# Patient Record
Sex: Female | Born: 1940 | Race: White | Hispanic: No | Marital: Married
Health system: Southern US, Community
[De-identification: ages and names within clinical notes are randomized; demographics above are authoritative.]

---

## 2004-01-26 ENCOUNTER — Emergency Department: Payer: Self-pay | Admitting: Emergency Medicine

## 2004-11-08 ENCOUNTER — Other Ambulatory Visit: Payer: Self-pay

## 2004-11-08 ENCOUNTER — Inpatient Hospital Stay: Payer: Self-pay | Admitting: Internal Medicine

## 2007-12-05 ENCOUNTER — Emergency Department: Payer: Self-pay | Admitting: Emergency Medicine

## 2009-08-09 ENCOUNTER — Emergency Department: Payer: Self-pay | Admitting: Emergency Medicine

## 2009-08-14 ENCOUNTER — Emergency Department: Payer: Self-pay | Admitting: Emergency Medicine

## 2009-09-07 ENCOUNTER — Emergency Department: Payer: Self-pay | Admitting: Emergency Medicine

## 2009-10-28 ENCOUNTER — Inpatient Hospital Stay: Payer: Self-pay | Admitting: Family Medicine

## 2009-12-15 ENCOUNTER — Emergency Department: Payer: Self-pay | Admitting: Emergency Medicine

## 2009-12-30 ENCOUNTER — Emergency Department: Payer: Self-pay | Admitting: Emergency Medicine

## 2010-03-01 ENCOUNTER — Ambulatory Visit: Payer: Self-pay | Admitting: Ophthalmology

## 2010-03-07 ENCOUNTER — Ambulatory Visit: Payer: Self-pay | Admitting: Ophthalmology

## 2010-07-14 ENCOUNTER — Emergency Department: Payer: Self-pay | Admitting: Emergency Medicine

## 2010-08-21 ENCOUNTER — Emergency Department: Payer: Self-pay | Admitting: Internal Medicine

## 2011-06-30 ENCOUNTER — Emergency Department: Payer: Self-pay | Admitting: Emergency Medicine

## 2011-06-30 LAB — COMPREHENSIVE METABOLIC PANEL
Albumin: 3.1 g/dL — ABNORMAL LOW (ref 3.4–5.0)
Alkaline Phosphatase: 192 U/L — ABNORMAL HIGH (ref 50–136)
BUN: 30 mg/dL — ABNORMAL HIGH (ref 7–18)
Bilirubin,Total: 0.3 mg/dL (ref 0.2–1.0)
Co2: 25 mmol/L (ref 21–32)
Creatinine: 0.95 mg/dL (ref 0.60–1.30)
EGFR (African American): >60
EGFR (Non-African Amer.): >60
Glucose: 556 mg/dL (ref 65–99)
Osmolality: 302 (ref 275–301)
SGPT (ALT): 80 U/L — ABNORMAL HIGH

## 2011-06-30 LAB — URINALYSIS, COMPLETE
Bilirubin,UR: NEGATIVE
Blood: NEGATIVE
Glucose,UR: >=500 mg/dL (ref 0–75)
Ketone: NEGATIVE
Nitrite: NEGATIVE
Ph: 5 (ref 4.5–8.0)
Protein: 100
Specific Gravity: 1.018 (ref 1.003–1.030)
Squamous Epithelial: NONE SEEN
WBC UR: 5 /HPF (ref 0–5)

## 2011-06-30 LAB — CBC
HCT: 34.9 % — ABNORMAL LOW (ref 35.0–47.0)
MCV: 94 fL (ref 80–100)
Platelet: 168 10*3/uL (ref 150–440)
RBC: 3.73 10*6/uL — ABNORMAL LOW (ref 3.80–5.20)
WBC: 7.9 10*3/uL (ref 3.6–11.0)

## 2011-06-30 LAB — PROTIME-INR: Prothrombin Time: 12.8 secs (ref 11.5–14.7)

## 2011-06-30 LAB — APTT: Activated PTT: 31.5 secs (ref 23.6–35.9)

## 2011-10-16 ENCOUNTER — Emergency Department: Payer: Self-pay | Admitting: Emergency Medicine

## 2011-10-16 LAB — BASIC METABOLIC PANEL
Calcium, Total: 8.9 mg/dL (ref 8.5–10.1)
Chloride: 103 mmol/L (ref 98–107)
Co2: 24 mmol/L (ref 21–32)
Glucose: 406 mg/dL — ABNORMAL HIGH (ref 65–99)
Osmolality: 293 (ref 275–301)
Potassium: 4.4 mmol/L (ref 3.5–5.1)

## 2011-10-16 LAB — URINALYSIS, COMPLETE
Bilirubin,UR: NEGATIVE
Ketone: NEGATIVE
Leukocyte Esterase: NEGATIVE
Nitrite: POSITIVE
Protein: 100
RBC,UR: 3 /HPF (ref 0–5)
WBC UR: 4 /HPF (ref 0–5)

## 2011-10-16 LAB — CBC
HCT: 39.3 % (ref 35.0–47.0)
HGB: 13.5 g/dL (ref 12.0–16.0)
MCH: 30.9 pg (ref 26.0–34.0)
MCV: 90 fL (ref 80–100)
Platelet: 175 10*3/uL (ref 150–440)
RDW: 13 % (ref 11.5–14.5)
WBC: 8.4 10*3/uL (ref 3.6–11.0)

## 2011-11-07 ENCOUNTER — Emergency Department: Payer: Self-pay | Admitting: Emergency Medicine

## 2011-11-07 LAB — URINALYSIS, COMPLETE
Bilirubin,UR: NEGATIVE
Glucose,UR: >=500 mg/dL (ref 0–75)
Hyaline Cast: 1
Ketone: NEGATIVE
Leukocyte Esterase: NEGATIVE
Nitrite: NEGATIVE
Specific Gravity: 1.025 (ref 1.003–1.030)
WBC UR: 4 /HPF (ref 0–5)

## 2011-11-07 LAB — COMPREHENSIVE METABOLIC PANEL
Anion Gap: 7 (ref 7–16)
Calcium, Total: 8.4 mg/dL — ABNORMAL LOW (ref 8.5–10.1)
Chloride: 107 mmol/L (ref 98–107)
Co2: 26 mmol/L (ref 21–32)
EGFR (African American): >60
EGFR (Non-African Amer.): >60
Potassium: 3.6 mmol/L (ref 3.5–5.1)
SGOT(AST): 22 U/L (ref 15–37)
SGPT (ALT): 26 U/L (ref 12–78)
Total Protein: 6.2 g/dL — ABNORMAL LOW (ref 6.4–8.2)

## 2011-11-07 LAB — CBC WITH DIFFERENTIAL/PLATELET
Basophil #: 0.1 10*3/uL (ref 0.0–0.1)
Eosinophil #: 0.1 10*3/uL (ref 0.0–0.7)
Eosinophil %: 0.8 %
HCT: 34.9 % — ABNORMAL LOW (ref 35.0–47.0)
Lymphocyte %: 26.8 %
MCHC: 35.9 g/dL (ref 32.0–36.0)
Monocyte #: 0.5 x10 3/mm (ref 0.2–0.9)
Neutrophil %: 65.7 %
Platelet: 145 10*3/uL — ABNORMAL LOW (ref 150–440)
RBC: 3.99 10*6/uL (ref 3.80–5.20)
RDW: 12.8 % (ref 11.5–14.5)

## 2011-11-07 LAB — TROPONIN I: Troponin-I: <0.02 ng/mL

## 2011-11-07 LAB — DRUG SCREEN, URINE
Amphetamines, Ur Screen: NEGATIVE (ref ?–1000)
Barbiturates, Ur Screen: NEGATIVE (ref ?–200)
Benzodiazepine, Ur Scrn: NEGATIVE (ref ?–200)
Cannabinoid 50 Ng, Ur ~~LOC~~: NEGATIVE (ref ?–50)
Cocaine Metabolite,Ur ~~LOC~~: NEGATIVE (ref ?–300)
Opiate, Ur Screen: POSITIVE (ref ?–300)
Phencyclidine (PCP) Ur S: NEGATIVE (ref ?–25)
Tricyclic, Ur Screen: NEGATIVE (ref ?–1000)

## 2011-11-07 LAB — CK TOTAL AND CKMB (NOT AT ARMC)
CK, Total: 52 U/L (ref 21–215)
CK-MB: 2.5 ng/mL (ref 0.5–3.6)

## 2011-11-07 LAB — ETHANOL: Ethanol %: <0.003 % (ref 0.000–0.080)

## 2011-12-20 ENCOUNTER — Emergency Department: Payer: Self-pay | Admitting: Emergency Medicine

## 2011-12-20 LAB — BASIC METABOLIC PANEL
Anion Gap: 7 (ref 7–16)
Calcium, Total: 8.2 mg/dL — ABNORMAL LOW (ref 8.5–10.1)
Chloride: 103 mmol/L (ref 98–107)
Co2: 24 mmol/L (ref 21–32)
EGFR (African American): >60
EGFR (Non-African Amer.): >60
Osmolality: 298 (ref 275–301)
Sodium: 134 mmol/L — ABNORMAL LOW (ref 136–145)

## 2011-12-20 LAB — TROPONIN I
Troponin-I: 0.03 ng/mL
Troponin-I: 0.03 ng/mL

## 2011-12-20 LAB — CK TOTAL AND CKMB (NOT AT ARMC)
CK, Total: 118 U/L (ref 21–215)
CK, Total: 132 U/L (ref 21–215)
CK-MB: 3.6 ng/mL (ref 0.5–3.6)
CK-MB: 4.2 ng/mL — ABNORMAL HIGH (ref 0.5–3.6)

## 2011-12-20 LAB — CBC
Platelet: 177 10*3/uL (ref 150–440)
WBC: 6.5 10*3/uL (ref 3.6–11.0)

## 2012-01-07 ENCOUNTER — Inpatient Hospital Stay: Payer: Self-pay | Admitting: Family Medicine

## 2012-01-07 LAB — URINALYSIS, COMPLETE
Bacteria: NONE SEEN
Bilirubin,UR: NEGATIVE
Glucose,UR: >=500 mg/dL (ref 0–75)
Ketone: NEGATIVE
Nitrite: NEGATIVE
Protein: >=500
RBC,UR: 30 /HPF (ref 0–5)
Specific Gravity: 1.028 (ref 1.003–1.030)
WBC UR: 241 /HPF (ref 0–5)

## 2012-01-07 LAB — COMPREHENSIVE METABOLIC PANEL
Alkaline Phosphatase: 186 U/L — ABNORMAL HIGH (ref 50–136)
BUN: 6 mg/dL — ABNORMAL LOW (ref 7–18)
Calcium, Total: 8.4 mg/dL — ABNORMAL LOW (ref 8.5–10.1)
Co2: 30 mmol/L (ref 21–32)
EGFR (Non-African Amer.): >60
Glucose: 384 mg/dL — ABNORMAL HIGH (ref 65–99)
SGOT(AST): 20 U/L (ref 15–37)
SGPT (ALT): 17 U/L (ref 12–78)
Sodium: 137 mmol/L (ref 136–145)
Total Protein: 6.7 g/dL (ref 6.4–8.2)

## 2012-01-07 LAB — DRUG SCREEN, URINE
Amphetamines, Ur Screen: NEGATIVE (ref ?–1000)
Cocaine Metabolite,Ur ~~LOC~~: NEGATIVE (ref ?–300)
MDMA (Ecstasy)Ur Screen: NEGATIVE (ref ?–500)
Methadone, Ur Screen: NEGATIVE (ref ?–300)
Opiate, Ur Screen: POSITIVE (ref ?–300)
Phencyclidine (PCP) Ur S: NEGATIVE (ref ?–25)
Tricyclic, Ur Screen: NEGATIVE (ref ?–1000)

## 2012-01-07 LAB — CBC WITH DIFFERENTIAL/PLATELET
MCHC: 33.4 g/dL (ref 32.0–36.0)
MCV: 90 fL (ref 80–100)
Platelet: 231 10*3/uL (ref 150–440)
RDW: 13.3 % (ref 11.5–14.5)
Segmented Neutrophils: 57 %
Variant Lymphocyte - H1-Rlymph: 1 %
WBC: 8.5 10*3/uL (ref 3.6–11.0)

## 2012-01-07 LAB — MAGNESIUM: Magnesium: 1.7 mg/dL — ABNORMAL LOW

## 2012-01-07 LAB — SEDIMENTATION RATE: Erythrocyte Sed Rate: 85 mm/hr — ABNORMAL HIGH (ref 0–30)

## 2012-01-08 LAB — CBC WITH DIFFERENTIAL/PLATELET
Eosinophil #: 0.1 10*3/uL (ref 0.0–0.7)
Eosinophil %: 0.8 %
HCT: 32.8 % — ABNORMAL LOW (ref 35.0–47.0)
Lymphocyte #: 2.8 10*3/uL (ref 1.0–3.6)
MCH: 31.2 pg (ref 26.0–34.0)
MCHC: 35.3 g/dL (ref 32.0–36.0)
MCV: 89 fL (ref 80–100)
Monocyte #: 0.8 x10 3/mm (ref 0.2–0.9)
Neutrophil #: 4.5 10*3/uL (ref 1.4–6.5)
Platelet: 201 10*3/uL (ref 150–440)
RDW: 13.1 % (ref 11.5–14.5)
WBC: 8.2 10*3/uL (ref 3.6–11.0)

## 2012-01-08 LAB — BASIC METABOLIC PANEL
Anion Gap: 6 — ABNORMAL LOW (ref 7–16)
Anion Gap: 5 — ABNORMAL LOW (ref 7–16)
BUN: 7 mg/dL (ref 7–18)
Calcium, Total: 8 mg/dL — ABNORMAL LOW (ref 8.5–10.1)
Chloride: 108 mmol/L — ABNORMAL HIGH (ref 98–107)
Co2: 28 mmol/L (ref 21–32)
Creatinine: 0.78 mg/dL (ref 0.60–1.30)
EGFR (African American): >60
EGFR (Non-African Amer.): >60
EGFR (Non-African Amer.): >60
Glucose: 139 mg/dL — ABNORMAL HIGH (ref 65–99)
Glucose: 196 mg/dL — ABNORMAL HIGH (ref 65–99)
Osmolality: 283 (ref 275–301)
Osmolality: 285 (ref 275–301)
Sodium: 142 mmol/L (ref 136–145)

## 2012-01-09 LAB — CBC WITH DIFFERENTIAL/PLATELET
Basophil #: 0 10*3/uL (ref 0.0–0.1)
Basophil %: 0.5 %
HCT: 31.7 % — ABNORMAL LOW (ref 35.0–47.0)
HGB: 11 g/dL — ABNORMAL LOW (ref 12.0–16.0)
Lymphocyte #: 2.6 10*3/uL (ref 1.0–3.6)
Lymphocyte %: 25.4 %
MCH: 31.3 pg (ref 26.0–34.0)
MCHC: 34.8 g/dL (ref 32.0–36.0)
MCV: 90 fL (ref 80–100)
Monocyte #: 0.8 x10 3/mm (ref 0.2–0.9)
Monocyte %: 7.8 %
Neutrophil #: 6.6 10*3/uL — ABNORMAL HIGH (ref 1.4–6.5)
Neutrophil %: 65.9 %
RBC: 3.53 10*6/uL — ABNORMAL LOW (ref 3.80–5.20)
WBC: 10.1 10*3/uL (ref 3.6–11.0)

## 2012-01-09 LAB — LIPID PANEL
Cholesterol: 140 mg/dL (ref 0–200)
HDL Cholesterol: 62 mg/dL — ABNORMAL HIGH (ref 40–60)
VLDL Cholesterol, Calc: 16 mg/dL (ref 5–40)

## 2012-01-09 LAB — HEMOGLOBIN A1C: Hemoglobin A1C: 12 % — ABNORMAL HIGH (ref 4.2–6.3)

## 2012-01-10 LAB — BASIC METABOLIC PANEL
Anion Gap: 7 (ref 7–16)
Calcium, Total: 8.4 mg/dL — ABNORMAL LOW (ref 8.5–10.1)
Chloride: 105 mmol/L (ref 98–107)
Co2: 27 mmol/L (ref 21–32)
EGFR (African American): >60
Osmolality: 282 (ref 275–301)

## 2012-01-11 LAB — CBC WITH DIFFERENTIAL/PLATELET
Basophil #: 0.1 10*3/uL (ref 0.0–0.1)
Eosinophil %: 0.7 %
HCT: 33.1 % — ABNORMAL LOW (ref 35.0–47.0)
HGB: 11.6 g/dL — ABNORMAL LOW (ref 12.0–16.0)
Lymphocyte #: 1.9 10*3/uL (ref 1.0–3.6)
MCH: 31.5 pg (ref 26.0–34.0)
MCV: 90 fL (ref 80–100)
Monocyte #: 0.5 x10 3/mm (ref 0.2–0.9)
Monocyte %: 6.8 %
Neutrophil %: 67.4 %
Platelet: 243 10*3/uL (ref 150–440)
RBC: 3.7 10*6/uL — ABNORMAL LOW (ref 3.80–5.20)
RDW: 12.9 % (ref 11.5–14.5)

## 2012-01-11 LAB — BASIC METABOLIC PANEL
Calcium, Total: 8.3 mg/dL — ABNORMAL LOW (ref 8.5–10.1)
Co2: 28 mmol/L (ref 21–32)
Creatinine: 0.81 mg/dL (ref 0.60–1.30)
EGFR (African American): >60
EGFR (Non-African Amer.): >60
Glucose: 301 mg/dL — ABNORMAL HIGH (ref 65–99)
Potassium: 4.7 mmol/L (ref 3.5–5.1)
Sodium: 137 mmol/L (ref 136–145)

## 2012-01-11 LAB — VANCOMYCIN, TROUGH: Vancomycin, Trough: 5 ug/mL — ABNORMAL LOW (ref 10–20)

## 2012-01-12 LAB — CBC WITH DIFFERENTIAL/PLATELET
Basophil %: 0.7 %
Eosinophil #: 0 10*3/uL (ref 0.0–0.7)
Eosinophil %: 0.3 %
HCT: 35.4 % (ref 35.0–47.0)
HGB: 12.2 g/dL (ref 12.0–16.0)
Lymphocyte #: 1.6 10*3/uL (ref 1.0–3.6)
Lymphocyte %: 17.8 %
MCH: 31 pg (ref 26.0–34.0)
MCV: 90 fL (ref 80–100)
Monocyte #: 0.5 x10 3/mm (ref 0.2–0.9)
Monocyte %: 6 %
Platelet: 286 10*3/uL (ref 150–440)
RBC: 3.94 10*6/uL (ref 3.80–5.20)
WBC: 9 10*3/uL (ref 3.6–11.0)

## 2012-01-12 LAB — BASIC METABOLIC PANEL
Anion Gap: 4 — ABNORMAL LOW (ref 7–16)
Calcium, Total: 8.7 mg/dL (ref 8.5–10.1)
Co2: 27 mmol/L (ref 21–32)
Creatinine: 0.84 mg/dL (ref 0.60–1.30)
EGFR (African American): >60
Glucose: 317 mg/dL — ABNORMAL HIGH (ref 65–99)
Osmolality: 275 (ref 275–301)

## 2012-01-13 LAB — BASIC METABOLIC PANEL
Anion Gap: 2 — ABNORMAL LOW (ref 7–16)
Chloride: 101 mmol/L (ref 98–107)
Co2: 29 mmol/L (ref 21–32)
EGFR (African American): >60
EGFR (Non-African Amer.): >60
Glucose: 342 mg/dL — ABNORMAL HIGH (ref 65–99)
Osmolality: 278 (ref 275–301)

## 2012-01-13 LAB — CBC WITH DIFFERENTIAL/PLATELET
Basophil #: 0.1 10*3/uL (ref 0.0–0.1)
Basophil %: 0.8 %
Eosinophil #: 0.1 10*3/uL (ref 0.0–0.7)
HCT: 34.9 % — ABNORMAL LOW (ref 35.0–47.0)
HGB: 11.9 g/dL — ABNORMAL LOW (ref 12.0–16.0)
Lymphocyte %: 23.8 %
MCH: 30.7 pg (ref 26.0–34.0)
MCHC: 34.2 g/dL (ref 32.0–36.0)
MCV: 90 fL (ref 80–100)
Monocyte #: 0.8 x10 3/mm (ref 0.2–0.9)

## 2012-01-14 LAB — CBC WITH DIFFERENTIAL/PLATELET
Basophil #: 0.1 10*3/uL (ref 0.0–0.1)
Basophil %: 0.8 %
Eosinophil #: 0.1 10*3/uL (ref 0.0–0.7)
Lymphocyte #: 3.3 10*3/uL (ref 1.0–3.6)
MCH: 31.1 pg (ref 26.0–34.0)
MCHC: 35.6 g/dL (ref 32.0–36.0)
MCV: 87 fL (ref 80–100)
Monocyte #: 1.1 x10 3/mm — ABNORMAL HIGH (ref 0.2–0.9)
Neutrophil %: 52.3 %
Platelet: 190 10*3/uL (ref 150–440)
RBC: 3.87 10*6/uL (ref 3.80–5.20)

## 2012-01-14 LAB — BASIC METABOLIC PANEL
Anion Gap: 7 (ref 7–16)
BUN: 14 mg/dL (ref 7–18)
Chloride: 102 mmol/L (ref 98–107)
Co2: 28 mmol/L (ref 21–32)
Creatinine: 0.81 mg/dL (ref 0.60–1.30)
EGFR (African American): >60
EGFR (Non-African Amer.): >60
Osmolality: 277 (ref 275–301)
Potassium: 3.7 mmol/L (ref 3.5–5.1)

## 2012-01-14 LAB — VANCOMYCIN, TROUGH: Vancomycin, Trough: 13 ug/mL (ref 10–20)

## 2012-01-15 LAB — CBC WITH DIFFERENTIAL/PLATELET
Basophil %: 0.8 %
Eosinophil #: 0.1 10*3/uL (ref 0.0–0.7)
Eosinophil %: 0.9 %
Lymphocyte %: 35.4 %
MCH: 29.9 pg (ref 26.0–34.0)
MCHC: 33.8 g/dL (ref 32.0–36.0)
Monocyte %: 10.4 %
Neutrophil #: 5 10*3/uL (ref 1.4–6.5)
Neutrophil %: 52.5 %
RBC: 3.73 10*6/uL — ABNORMAL LOW (ref 3.80–5.20)

## 2012-01-15 LAB — BASIC METABOLIC PANEL
Anion Gap: 5 — ABNORMAL LOW (ref 7–16)
Calcium, Total: 9 mg/dL (ref 8.5–10.1)
Co2: 29 mmol/L (ref 21–32)
EGFR (African American): >60
Glucose: 175 mg/dL — ABNORMAL HIGH (ref 65–99)
Osmolality: 275 (ref 275–301)
Potassium: 3.4 mmol/L — ABNORMAL LOW (ref 3.5–5.1)
Sodium: 135 mmol/L — ABNORMAL LOW (ref 136–145)

## 2012-01-16 ENCOUNTER — Emergency Department: Payer: Self-pay | Admitting: Emergency Medicine

## 2012-01-18 ENCOUNTER — Inpatient Hospital Stay: Payer: Self-pay | Admitting: Internal Medicine

## 2012-01-18 LAB — COMPREHENSIVE METABOLIC PANEL
Albumin: 2.4 g/dL — ABNORMAL LOW (ref 3.4–5.0)
Alkaline Phosphatase: 281 U/L — ABNORMAL HIGH (ref 50–136)
Anion Gap: 10 (ref 7–16)
BUN: 19 mg/dL — ABNORMAL HIGH (ref 7–18)
Calcium, Total: 8.9 mg/dL (ref 8.5–10.1)
Co2: 26 mmol/L (ref 21–32)
EGFR (African American): >60
Glucose: 470 mg/dL — ABNORMAL HIGH (ref 65–99)
Osmolality: 293 (ref 275–301)
Potassium: 3.4 mmol/L — ABNORMAL LOW (ref 3.5–5.1)
SGOT(AST): 18 U/L (ref 15–37)
SGPT (ALT): 27 U/L (ref 12–78)
Sodium: 135 mmol/L — ABNORMAL LOW (ref 136–145)

## 2012-01-18 LAB — URINALYSIS, COMPLETE
Bacteria: NONE SEEN
Bilirubin,UR: NEGATIVE
Glucose,UR: >=500 mg/dL (ref 0–75)
Ketone: NEGATIVE
Specific Gravity: 1.025 (ref 1.003–1.030)
Squamous Epithelial: 1

## 2012-01-18 LAB — CBC
HCT: 35.5 % (ref 35.0–47.0)
MCH: 29.9 pg (ref 26.0–34.0)
RBC: 3.97 10*6/uL (ref 3.80–5.20)
RDW: 12.7 % (ref 11.5–14.5)
WBC: 8.5 10*3/uL (ref 3.6–11.0)

## 2012-01-18 LAB — ETHANOL: Ethanol %: <0.003 % (ref 0.000–0.080)

## 2012-01-18 LAB — DRUG SCREEN, URINE
Barbiturates, Ur Screen: NEGATIVE (ref ?–200)
Cannabinoid 50 Ng, Ur ~~LOC~~: NEGATIVE (ref ?–50)
Cocaine Metabolite,Ur ~~LOC~~: NEGATIVE (ref ?–300)
Opiate, Ur Screen: NEGATIVE (ref ?–300)
Phencyclidine (PCP) Ur S: NEGATIVE (ref ?–25)

## 2012-01-19 LAB — BASIC METABOLIC PANEL
Calcium, Total: 8.4 mg/dL — ABNORMAL LOW (ref 8.5–10.1)
Chloride: 99 mmol/L (ref 98–107)
Co2: 25 mmol/L (ref 21–32)
Creatinine: 1.09 mg/dL (ref 0.60–1.30)
EGFR (Non-African Amer.): 52 — ABNORMAL LOW
Potassium: 3.5 mmol/L (ref 3.5–5.1)
Sodium: 134 mmol/L — ABNORMAL LOW (ref 136–145)

## 2012-01-19 LAB — CBC WITH DIFFERENTIAL/PLATELET
Basophil #: 0.1 10*3/uL (ref 0.0–0.1)
HCT: 32.4 % — ABNORMAL LOW (ref 35.0–47.0)
HGB: 11.1 g/dL — ABNORMAL LOW (ref 12.0–16.0)
Lymphocyte #: 3.1 10*3/uL (ref 1.0–3.6)
MCH: 30.1 pg (ref 26.0–34.0)
MCHC: 34.2 g/dL (ref 32.0–36.0)
MCV: 88 fL (ref 80–100)
Monocyte #: 0.8 x10 3/mm (ref 0.2–0.9)
Monocyte %: 8.8 %
Neutrophil #: 4.9 10*3/uL (ref 1.4–6.5)
Platelet: 351 10*3/uL (ref 150–440)
RDW: 13 % (ref 11.5–14.5)

## 2012-01-21 LAB — BASIC METABOLIC PANEL
BUN: 15 mg/dL (ref 7–18)
Calcium, Total: 8.4 mg/dL — ABNORMAL LOW (ref 8.5–10.1)
EGFR (African American): >60
EGFR (Non-African Amer.): >60
Glucose: 274 mg/dL — ABNORMAL HIGH (ref 65–99)
Osmolality: 284 (ref 275–301)
Potassium: 3.5 mmol/L (ref 3.5–5.1)
Sodium: 137 mmol/L (ref 136–145)

## 2012-01-21 LAB — WOUND AEROBIC CULTURE

## 2012-01-21 LAB — VANCOMYCIN, TROUGH: Vancomycin, Trough: 8 ug/mL — ABNORMAL LOW (ref 10–20)

## 2012-01-22 LAB — CBC WITH DIFFERENTIAL/PLATELET
Basophil #: 0.1 10*3/uL (ref 0.0–0.1)
Eosinophil #: 0.1 10*3/uL (ref 0.0–0.7)
HCT: 29.1 % — ABNORMAL LOW (ref 35.0–47.0)
Lymphocyte #: 2.5 10*3/uL (ref 1.0–3.6)
Lymphocyte %: 43.8 %
MCH: 30.1 pg (ref 26.0–34.0)
MCHC: 34.5 g/dL (ref 32.0–36.0)
MCV: 87 fL (ref 80–100)
Monocyte #: 0.7 x10 3/mm (ref 0.2–0.9)
Neutrophil #: 2.4 10*3/uL (ref 1.4–6.5)
RBC: 3.33 10*6/uL — ABNORMAL LOW (ref 3.80–5.20)
RDW: 12.9 % (ref 11.5–14.5)

## 2012-01-23 LAB — CBC WITH DIFFERENTIAL/PLATELET
Basophil #: 0.1 10*3/uL (ref 0.0–0.1)
Eosinophil #: 0.1 10*3/uL (ref 0.0–0.7)
Eosinophil %: 1.9 %
HCT: 29.6 % — ABNORMAL LOW (ref 35.0–47.0)
HGB: 10.3 g/dL — ABNORMAL LOW (ref 12.0–16.0)
Lymphocyte #: 2.4 10*3/uL (ref 1.0–3.6)
Lymphocyte %: 44.8 %
MCHC: 34.8 g/dL (ref 32.0–36.0)
Monocyte %: 10.4 %
Neutrophil #: 2.3 10*3/uL (ref 1.4–6.5)
Neutrophil %: 41.7 %
RBC: 3.39 10*6/uL — ABNORMAL LOW (ref 3.80–5.20)
RDW: 12.9 % (ref 11.5–14.5)
WBC: 5.4 10*3/uL (ref 3.6–11.0)

## 2012-01-23 LAB — BASIC METABOLIC PANEL
Anion Gap: 6 — ABNORMAL LOW (ref 7–16)
BUN: 21 mg/dL — ABNORMAL HIGH (ref 7–18)
Calcium, Total: 8.5 mg/dL (ref 8.5–10.1)
EGFR (Non-African Amer.): 57 — ABNORMAL LOW
Glucose: 269 mg/dL — ABNORMAL HIGH (ref 65–99)
Osmolality: 290 (ref 275–301)
Potassium: 3.8 mmol/L (ref 3.5–5.1)
Sodium: 139 mmol/L (ref 136–145)

## 2012-03-06 ENCOUNTER — Inpatient Hospital Stay: Payer: Self-pay | Admitting: Internal Medicine

## 2012-03-06 LAB — PROTIME-INR
INR: 1
Prothrombin Time: 13.6 secs (ref 11.5–14.7)

## 2012-03-06 LAB — CK TOTAL AND CKMB (NOT AT ARMC)
CK, Total: 48 U/L (ref 21–215)
CK, Total: 85 U/L (ref 21–215)
CK-MB: 1.5 ng/mL (ref 0.5–3.6)
CK-MB: 4.4 ng/mL — ABNORMAL HIGH (ref 0.5–3.6)

## 2012-03-06 LAB — BASIC METABOLIC PANEL
Anion Gap: 9 (ref 7–16)
Calcium, Total: 8.5 mg/dL (ref 8.5–10.1)
Chloride: 105 mmol/L (ref 98–107)
EGFR (Non-African Amer.): 60 — ABNORMAL LOW
Glucose: 291 mg/dL — ABNORMAL HIGH (ref 65–99)
Potassium: 3.9 mmol/L (ref 3.5–5.1)
Sodium: 140 mmol/L (ref 136–145)

## 2012-03-06 LAB — CBC
HCT: 30.3 % — ABNORMAL LOW (ref 35.0–47.0)
MCH: 30.2 pg (ref 26.0–34.0)
MCHC: 33.8 g/dL (ref 32.0–36.0)
Platelet: 221 10*3/uL (ref 150–440)
RBC: 3.38 10*6/uL — ABNORMAL LOW (ref 3.80–5.20)
RDW: 14.8 % — ABNORMAL HIGH (ref 11.5–14.5)

## 2012-03-06 LAB — TROPONIN I: Troponin-I: 1.08 ng/mL — ABNORMAL HIGH

## 2012-03-06 LAB — CLOSTRIDIUM DIFFICILE BY PCR

## 2012-03-06 LAB — LIPASE, BLOOD: Lipase: 54 U/L — ABNORMAL LOW (ref 73–393)

## 2012-03-07 LAB — LIPID PANEL
Cholesterol: 130 mg/dL (ref 0–200)
HDL Cholesterol: 60 mg/dL (ref 40–60)
Ldl Cholesterol, Calc: 50 mg/dL (ref 0–100)
Triglycerides: 99 mg/dL (ref 0–200)
VLDL Cholesterol, Calc: 20 mg/dL (ref 5–40)

## 2012-03-07 LAB — CK TOTAL AND CKMB (NOT AT ARMC): CK-MB: 2.8 ng/mL (ref 0.5–3.6)

## 2012-03-07 LAB — TROPONIN I: Troponin-I: 0.93 ng/mL — ABNORMAL HIGH

## 2012-03-08 LAB — CBC WITH DIFFERENTIAL/PLATELET
Basophil %: 1.3 %
Eosinophil %: 2 %
HCT: 29.6 % — ABNORMAL LOW (ref 35.0–47.0)
HGB: 10.4 g/dL — ABNORMAL LOW (ref 12.0–16.0)
Lymphocyte #: 2.2 10*3/uL (ref 1.0–3.6)
MCV: 89 fL (ref 80–100)
Monocyte #: 0.6 x10 3/mm (ref 0.2–0.9)
Monocyte %: 11 %
Neutrophil %: 46.7 %
Platelet: 243 10*3/uL (ref 150–440)
RBC: 3.32 10*6/uL — ABNORMAL LOW (ref 3.80–5.20)
RDW: 14.8 % — ABNORMAL HIGH (ref 11.5–14.5)
WBC: 5.6 10*3/uL (ref 3.6–11.0)

## 2012-03-08 LAB — BASIC METABOLIC PANEL
BUN: 20 mg/dL — ABNORMAL HIGH (ref 7–18)
Calcium, Total: 8.7 mg/dL (ref 8.5–10.1)
Chloride: 105 mmol/L (ref 98–107)
Creatinine: 0.9 mg/dL (ref 0.60–1.30)
EGFR (Non-African Amer.): >60
Glucose: 201 mg/dL — ABNORMAL HIGH (ref 65–99)
Osmolality: 284 (ref 275–301)
Sodium: 138 mmol/L (ref 136–145)

## 2012-03-10 LAB — CREATININE, SERUM: EGFR (Non-African Amer.): >60

## 2012-03-15 ENCOUNTER — Other Ambulatory Visit: Payer: Self-pay | Admitting: Family Medicine

## 2012-03-15 LAB — BUN: BUN: 22 mg/dL — ABNORMAL HIGH (ref 7–18)

## 2012-03-15 LAB — CBC WITH DIFFERENTIAL/PLATELET
Eosinophil #: 0 10*3/uL (ref 0.0–0.7)
HGB: 11.7 g/dL — ABNORMAL LOW (ref 12.0–16.0)
Lymphocyte %: 25 %
Monocyte #: 0.4 x10 3/mm (ref 0.2–0.9)
Neutrophil #: 3.5 10*3/uL (ref 1.4–6.5)
Platelet: 175 10*3/uL (ref 150–440)
RBC: 3.87 10*6/uL (ref 3.80–5.20)
RDW: 15.1 % — ABNORMAL HIGH (ref 11.5–14.5)

## 2012-03-15 LAB — VANCOMYCIN, TROUGH: Vancomycin, Trough: 15 ug/mL (ref 10–20)

## 2012-03-15 LAB — CREATININE, SERUM
Creatinine: 1.06 mg/dL (ref 0.60–1.30)
EGFR (African American): >60
EGFR (Non-African Amer.): 53 — ABNORMAL LOW

## 2012-03-19 ENCOUNTER — Other Ambulatory Visit: Payer: Self-pay | Admitting: Family Medicine

## 2012-03-19 LAB — COMPREHENSIVE METABOLIC PANEL
Albumin: 2.8 g/dL — ABNORMAL LOW (ref 3.4–5.0)
BUN: 19 mg/dL — ABNORMAL HIGH (ref 7–18)
Bilirubin,Total: 0.3 mg/dL (ref 0.2–1.0)
Calcium, Total: 8.3 mg/dL — ABNORMAL LOW (ref 8.5–10.1)
EGFR (African American): 56 — ABNORMAL LOW
EGFR (Non-African Amer.): 48 — ABNORMAL LOW
Glucose: 436 mg/dL — ABNORMAL HIGH (ref 65–99)
Osmolality: 295 (ref 275–301)
Potassium: 3.4 mmol/L — ABNORMAL LOW (ref 3.5–5.1)
SGOT(AST): 77 U/L — ABNORMAL HIGH (ref 15–37)
SGPT (ALT): 54 U/L (ref 12–78)
Sodium: 137 mmol/L (ref 136–145)

## 2012-03-19 LAB — CBC WITH DIFFERENTIAL/PLATELET
Basophil #: 0.1 10*3/uL (ref 0.0–0.1)
Basophil %: 1.1 %
Eosinophil #: 0 10*3/uL (ref 0.0–0.7)
Eosinophil %: 0.9 %
HCT: 37.5 % (ref 35.0–47.0)
HGB: 12.2 g/dL (ref 12.0–16.0)
MCH: 29.5 pg (ref 26.0–34.0)
MCV: 91 fL (ref 80–100)
Neutrophil %: 61.7 %
Platelet: 168 10*3/uL (ref 150–440)
RBC: 4.13 10*6/uL (ref 3.80–5.20)
RDW: 15.6 % — ABNORMAL HIGH (ref 11.5–14.5)

## 2012-03-19 LAB — HEMOGLOBIN A1C: Hemoglobin A1C: 10 % — ABNORMAL HIGH (ref 4.2–6.3)

## 2012-03-24 ENCOUNTER — Other Ambulatory Visit: Payer: Self-pay | Admitting: Family Medicine

## 2012-03-24 LAB — BUN: BUN: 14 mg/dL (ref 7–18)

## 2012-03-24 LAB — VANCOMYCIN, TROUGH: Vancomycin, Trough: 14 ug/mL (ref 10–20)

## 2012-03-24 LAB — CREATININE, SERUM
Creatinine: 1.02 mg/dL (ref 0.60–1.30)
EGFR (African American): >60
EGFR (Non-African Amer.): 56 — ABNORMAL LOW

## 2012-03-29 ENCOUNTER — Other Ambulatory Visit: Payer: Self-pay | Admitting: Family Medicine

## 2012-03-29 LAB — CREATININE, SERUM
Creatinine: 1.17 mg/dL (ref 0.60–1.30)
EGFR (African American): 55 — ABNORMAL LOW

## 2012-04-01 ENCOUNTER — Other Ambulatory Visit: Payer: Self-pay | Admitting: Family Medicine

## 2012-05-11 ENCOUNTER — Ambulatory Visit: Payer: Self-pay | Admitting: Ophthalmology

## 2012-05-11 LAB — POTASSIUM: Potassium: 3.7 mmol/L (ref 3.5–5.1)

## 2012-05-24 ENCOUNTER — Ambulatory Visit: Payer: Self-pay | Admitting: Ophthalmology

## 2012-06-07 ENCOUNTER — Other Ambulatory Visit: Payer: Self-pay

## 2012-06-07 LAB — URINALYSIS, COMPLETE
Glucose,UR: NEGATIVE mg/dL (ref 0–75)
Ketone: NEGATIVE
Nitrite: NEGATIVE
Ph: 5 (ref 4.5–8.0)
Protein: 100
Squamous Epithelial: 1
WBC UR: 41 /HPF (ref 0–5)

## 2012-06-07 LAB — COMPREHENSIVE METABOLIC PANEL
Alkaline Phosphatase: 251 U/L — ABNORMAL HIGH (ref 50–136)
Bilirubin,Total: 0.3 mg/dL (ref 0.2–1.0)
Calcium, Total: 8.7 mg/dL (ref 8.5–10.1)
Co2: 21 mmol/L (ref 21–32)
EGFR (African American): 43 — ABNORMAL LOW
EGFR (Non-African Amer.): 37 — ABNORMAL LOW
Glucose: 185 mg/dL — ABNORMAL HIGH (ref 65–99)
Osmolality: 297 (ref 275–301)
Potassium: 4.1 mmol/L (ref 3.5–5.1)
SGOT(AST): 26 U/L (ref 15–37)
SGPT (ALT): 25 U/L (ref 12–78)
Total Protein: 7 g/dL (ref 6.4–8.2)

## 2012-06-07 LAB — CBC WITH DIFFERENTIAL/PLATELET
Basophil %: 0.9 %
HCT: 33 % — ABNORMAL LOW (ref 35.0–47.0)
Lymphocyte #: 2.9 10*3/uL (ref 1.0–3.6)
MCHC: 33.7 g/dL (ref 32.0–36.0)
MCV: 83 fL (ref 80–100)
Monocyte #: 0.5 x10 3/mm (ref 0.2–0.9)
Monocyte %: 6.7 %
Neutrophil #: 3.8 10*3/uL (ref 1.4–6.5)
Neutrophil %: 50.9 %
Platelet: 183 10*3/uL (ref 150–440)
RDW: 13.9 % (ref 11.5–14.5)
WBC: 7.5 10*3/uL (ref 3.6–11.0)

## 2012-06-11 LAB — URINE CULTURE

## 2012-06-26 ENCOUNTER — Observation Stay: Payer: Self-pay | Admitting: Internal Medicine

## 2012-06-26 LAB — CBC
HCT: 30.5 % — ABNORMAL LOW (ref 35.0–47.0)
WBC: 7.6 10*3/uL (ref 3.6–11.0)

## 2012-06-26 LAB — COMPREHENSIVE METABOLIC PANEL
Albumin: 2.8 g/dL — ABNORMAL LOW (ref 3.4–5.0)
Alkaline Phosphatase: 228 U/L — ABNORMAL HIGH (ref 50–136)
BUN: 79 mg/dL — ABNORMAL HIGH (ref 7–18)
Bilirubin,Total: 0.4 mg/dL (ref 0.2–1.0)
Calcium, Total: 8.6 mg/dL (ref 8.5–10.1)
EGFR (African American): 39 — ABNORMAL LOW
Potassium: 4.4 mmol/L (ref 3.5–5.1)

## 2012-06-26 LAB — CK TOTAL AND CKMB (NOT AT ARMC)
CK, Total: 56 U/L (ref 21–215)
CK-MB: 2.9 ng/mL (ref 0.5–3.6)
CK-MB: 2.6 ng/mL (ref 0.5–3.6)

## 2012-06-26 LAB — TROPONIN I: Troponin-I: <0.02 ng/mL

## 2012-06-27 LAB — BASIC METABOLIC PANEL
Anion Gap: 5 — ABNORMAL LOW (ref 7–16)
Chloride: 113 mmol/L — ABNORMAL HIGH (ref 98–107)
Co2: 25 mmol/L (ref 21–32)
EGFR (African American): 56 — ABNORMAL LOW
Osmolality: 310 (ref 275–301)
Potassium: 4.1 mmol/L (ref 3.5–5.1)
Sodium: 143 mmol/L (ref 136–145)

## 2012-06-27 LAB — CBC WITH DIFFERENTIAL/PLATELET
Basophil %: 0.6 %
Eosinophil #: 0.1 10*3/uL (ref 0.0–0.7)
Eosinophil %: 1.7 %
HCT: 30.7 % — ABNORMAL LOW (ref 35.0–47.0)
HGB: 10.6 g/dL — ABNORMAL LOW (ref 12.0–16.0)
Lymphocyte #: 2.8 10*3/uL (ref 1.0–3.6)
Lymphocyte %: 36.1 %
MCH: 28.8 pg (ref 26.0–34.0)
MCV: 84 fL (ref 80–100)
Neutrophil #: 3.9 10*3/uL (ref 1.4–6.5)
Neutrophil %: 51.9 %
RBC: 3.68 10*6/uL — ABNORMAL LOW (ref 3.80–5.20)
RDW: 13.9 % (ref 11.5–14.5)

## 2012-06-27 LAB — HEMOGLOBIN A1C: Hemoglobin A1C: 8.3 % — ABNORMAL HIGH (ref 4.2–6.3)

## 2012-06-27 LAB — TROPONIN I: Troponin-I: <0.02 ng/mL

## 2012-06-27 LAB — CK TOTAL AND CKMB (NOT AT ARMC): CK, Total: 53 U/L (ref 21–215)

## 2012-07-18 ENCOUNTER — Ambulatory Visit: Payer: Self-pay

## 2012-07-18 LAB — URINALYSIS, COMPLETE
Blood: NEGATIVE
Ketone: NEGATIVE
Squamous Epithelial: <1
WBC UR: 26 /HPF (ref 0–5)

## 2012-07-20 LAB — URINE CULTURE

## 2012-07-24 ENCOUNTER — Other Ambulatory Visit: Payer: Self-pay | Admitting: Family Medicine

## 2012-07-24 LAB — COMPREHENSIVE METABOLIC PANEL
Albumin: 2.9 g/dL — ABNORMAL LOW (ref 3.4–5.0)
Alkaline Phosphatase: 315 U/L — ABNORMAL HIGH (ref 50–136)
BUN: 45 mg/dL — ABNORMAL HIGH (ref 7–18)
Bilirubin,Total: 0.3 mg/dL (ref 0.2–1.0)
Chloride: 114 mmol/L — ABNORMAL HIGH (ref 98–107)
Co2: 26 mmol/L (ref 21–32)
Creatinine: 1.63 mg/dL — ABNORMAL HIGH (ref 0.60–1.30)
EGFR (African American): 37 — ABNORMAL LOW
EGFR (Non-African Amer.): 32 — ABNORMAL LOW
Glucose: 117 mg/dL — ABNORMAL HIGH (ref 65–99)
Sodium: 145 mmol/L (ref 136–145)
Total Protein: 6.4 g/dL (ref 6.4–8.2)

## 2012-07-24 LAB — CBC WITH DIFFERENTIAL/PLATELET
Basophil #: 0.1 10*3/uL (ref 0.0–0.1)
Basophil %: 0.6 %
HCT: 31.7 % — ABNORMAL LOW (ref 35.0–47.0)
Lymphocyte #: 2.7 10*3/uL (ref 1.0–3.6)
Lymphocyte %: 28.8 %
MCHC: 34.1 g/dL (ref 32.0–36.0)
MCV: 84 fL (ref 80–100)
Monocyte #: 0.8 x10 3/mm (ref 0.2–0.9)
Monocyte %: 8 %
Neutrophil #: 5.7 10*3/uL (ref 1.4–6.5)
Neutrophil %: 61.2 %
RBC: 3.76 10*6/uL — ABNORMAL LOW (ref 3.80–5.20)
RDW: 14.7 % — ABNORMAL HIGH (ref 11.5–14.5)

## 2012-08-27 ENCOUNTER — Ambulatory Visit: Payer: Self-pay | Admitting: Hospice and Palliative Medicine

## 2012-09-05 ENCOUNTER — Emergency Department: Payer: Self-pay | Admitting: Unknown Physician Specialty

## 2012-09-05 LAB — CBC
HCT: 28.1 % — ABNORMAL LOW (ref 35.0–47.0)
MCH: 31 pg (ref 26.0–34.0)
MCV: 88 fL (ref 80–100)
Platelet: 167 10*3/uL (ref 150–440)
RBC: 3.18 10*6/uL — ABNORMAL LOW (ref 3.80–5.20)
RDW: 14.7 % — ABNORMAL HIGH (ref 11.5–14.5)
WBC: 10.6 10*3/uL (ref 3.6–11.0)

## 2012-09-05 LAB — COMPREHENSIVE METABOLIC PANEL
Albumin: 2.7 g/dL — ABNORMAL LOW (ref 3.4–5.0)
Anion Gap: 6 — ABNORMAL LOW (ref 7–16)
Calcium, Total: 8.7 mg/dL (ref 8.5–10.1)
Co2: 25 mmol/L (ref 21–32)
Creatinine: 1.24 mg/dL (ref 0.60–1.30)
EGFR (Non-African Amer.): 44 — ABNORMAL LOW
Glucose: 217 mg/dL — ABNORMAL HIGH (ref 65–99)
SGOT(AST): 19 U/L (ref 15–37)
SGPT (ALT): 15 U/L (ref 12–78)
Total Protein: 7 g/dL (ref 6.4–8.2)

## 2012-09-06 ENCOUNTER — Observation Stay: Payer: Self-pay | Admitting: Internal Medicine

## 2012-09-06 LAB — URINALYSIS, COMPLETE
Nitrite: POSITIVE
Ph: 5 (ref 4.5–8.0)
Protein: >=500
RBC,UR: 54 /HPF (ref 0–5)
Squamous Epithelial: 1
WBC UR: 405 /HPF (ref 0–5)

## 2012-09-06 LAB — CK TOTAL AND CKMB (NOT AT ARMC): CK, Total: 159 U/L (ref 21–215)

## 2012-09-06 LAB — TROPONIN I
Troponin-I: 0.06 ng/mL — ABNORMAL HIGH
Troponin-I: 0.04 ng/mL

## 2012-09-07 LAB — CBC WITH DIFFERENTIAL/PLATELET
Basophil #: 0 10*3/uL (ref 0.0–0.1)
Basophil %: 0.6 %
Eosinophil #: 0.1 10*3/uL (ref 0.0–0.7)
Eosinophil %: 1.6 %
HGB: 9.4 g/dL — ABNORMAL LOW (ref 12.0–16.0)
Lymphocyte #: 2.6 10*3/uL (ref 1.0–3.6)
Lymphocyte %: 30.8 %
MCH: 30.3 pg (ref 26.0–34.0)
MCV: 87 fL (ref 80–100)
Monocyte #: 0.7 x10 3/mm (ref 0.2–0.9)
WBC: 8.5 10*3/uL (ref 3.6–11.0)

## 2012-09-07 LAB — BASIC METABOLIC PANEL
BUN: 24 mg/dL — ABNORMAL HIGH (ref 7–18)
Calcium, Total: 8.5 mg/dL (ref 8.5–10.1)
Chloride: 107 mmol/L (ref 98–107)
Creatinine: 1.19 mg/dL (ref 0.60–1.30)
EGFR (Non-African Amer.): 46 — ABNORMAL LOW
Glucose: 220 mg/dL — ABNORMAL HIGH (ref 65–99)
Osmolality: 286 (ref 275–301)
Potassium: 4.1 mmol/L (ref 3.5–5.1)

## 2012-09-07 LAB — TROPONIN I: Troponin-I: 0.05 ng/mL

## 2012-09-07 LAB — LIPID PANEL
Cholesterol: 122 mg/dL (ref 0–200)
Ldl Cholesterol, Calc: 55 mg/dL (ref 0–100)

## 2012-09-27 ENCOUNTER — Ambulatory Visit: Payer: Self-pay | Admitting: Hospice and Palliative Medicine

## 2012-11-14 ENCOUNTER — Emergency Department: Payer: Self-pay | Admitting: Emergency Medicine

## 2012-11-14 LAB — COMPREHENSIVE METABOLIC PANEL
Albumin: 2.6 g/dL — ABNORMAL LOW (ref 3.4–5.0)
Alkaline Phosphatase: 253 U/L — ABNORMAL HIGH (ref 50–136)
BUN: 11 mg/dL (ref 7–18)
Bilirubin,Total: 0.4 mg/dL (ref 0.2–1.0)
Calcium, Total: 8.6 mg/dL (ref 8.5–10.1)
Chloride: 107 mmol/L (ref 98–107)
Co2: 24 mmol/L (ref 21–32)
Creatinine: 0.62 mg/dL (ref 0.60–1.30)
EGFR (African American): >60
Glucose: 309 mg/dL — ABNORMAL HIGH (ref 65–99)
SGPT (ALT): 40 U/L (ref 12–78)
Total Protein: 7 g/dL (ref 6.4–8.2)

## 2012-11-14 LAB — PROTIME-INR: Prothrombin Time: 14.4 secs (ref 11.5–14.7)

## 2012-11-14 LAB — CBC
HCT: 32.8 % — ABNORMAL LOW (ref 35.0–47.0)
Platelet: 223 10*3/uL (ref 150–440)
RBC: 3.69 10*6/uL — ABNORMAL LOW (ref 3.80–5.20)
RDW: 15.1 % — ABNORMAL HIGH (ref 11.5–14.5)
WBC: 8.2 10*3/uL (ref 3.6–11.0)

## 2012-11-14 LAB — CK TOTAL AND CKMB (NOT AT ARMC)
CK, Total: 97 U/L (ref 21–215)
CK-MB: 5.1 ng/mL — ABNORMAL HIGH (ref 0.5–3.6)

## 2012-11-15 LAB — TROPONIN I: Troponin-I: 2.5 ng/mL — ABNORMAL HIGH

## 2013-03-11 ENCOUNTER — Emergency Department: Payer: Self-pay | Admitting: Emergency Medicine

## 2013-03-11 LAB — CBC
HCT: 31 % — ABNORMAL LOW (ref 35.0–47.0)
HGB: 10.1 g/dL — AB (ref 12.0–16.0)
MCH: 29 pg (ref 26.0–34.0)
MCHC: 32.7 g/dL (ref 32.0–36.0)
MCV: 89 fL (ref 80–100)
Platelet: 186 10*3/uL (ref 150–440)
RBC: 3.5 10*6/uL — AB (ref 3.80–5.20)
RDW: 14.5 % (ref 11.5–14.5)
WBC: 6.6 10*3/uL (ref 3.6–11.0)

## 2013-03-11 LAB — BASIC METABOLIC PANEL
Anion Gap: 4 — ABNORMAL LOW (ref 7–16)
BUN: 19 mg/dL — ABNORMAL HIGH (ref 7–18)
CALCIUM: 8.3 mg/dL — AB (ref 8.5–10.1)
Chloride: 97 mmol/L — ABNORMAL LOW (ref 98–107)
Co2: 30 mmol/L (ref 21–32)
Creatinine: 1.18 mg/dL (ref 0.60–1.30)
EGFR (Non-African Amer.): 46 — ABNORMAL LOW
GFR CALC AF AMER: 54 — AB
Glucose: 523 mg/dL (ref 65–99)
Osmolality: 289 (ref 275–301)
Potassium: 3.2 mmol/L — ABNORMAL LOW (ref 3.5–5.1)
SODIUM: 131 mmol/L — AB (ref 136–145)

## 2013-03-11 LAB — TROPONIN I
Troponin-I: 0.03 ng/mL
Troponin-I: 0.02 ng/mL

## 2013-03-11 LAB — HEPATIC FUNCTION PANEL A (ARMC)
ALBUMIN: 2.4 g/dL — AB (ref 3.4–5.0)
AST: 20 U/L (ref 15–37)
Alkaline Phosphatase: 255 U/L — ABNORMAL HIGH
BILIRUBIN TOTAL: 0.3 mg/dL (ref 0.2–1.0)
Bilirubin, Direct: <0.1 mg/dL (ref 0.00–0.20)
SGPT (ALT): 21 U/L (ref 12–78)
Total Protein: 6.7 g/dL (ref 6.4–8.2)

## 2013-03-11 LAB — PRO B NATRIURETIC PEPTIDE: B-TYPE NATIURETIC PEPTID: 32907 pg/mL — AB (ref 0–125)

## 2013-03-11 LAB — ACETAMINOPHEN LEVEL: Acetaminophen: <2 ug/mL

## 2013-04-27 DEATH — deceased

## 2013-09-24 IMAGING — CT CT ANGIO EXTREM LOW BILAT
1 of 2 series · 13 of 32 positions shown, 19 images · IV contrast (APPLIED)
Comparison: none

REASON FOR EXAM: recent intervention right SFA now with worsening pain
and persistent ulcerations
COMMENTS:

PROCEDURE:     CT  - CT ANGIOGRAPHY LOWER EXTR W/CONT  - January 19, 2012 [DATE]
RESULT:     History: Pain.
Comparison Study: No recent.

[Series 4: upperrunoff 3.0 upper · axial · 0.89mm/px · z∈[+450,+996]mm · 13 of 212 slices shown, 19 images]
[im 15/212  soft-tissue]
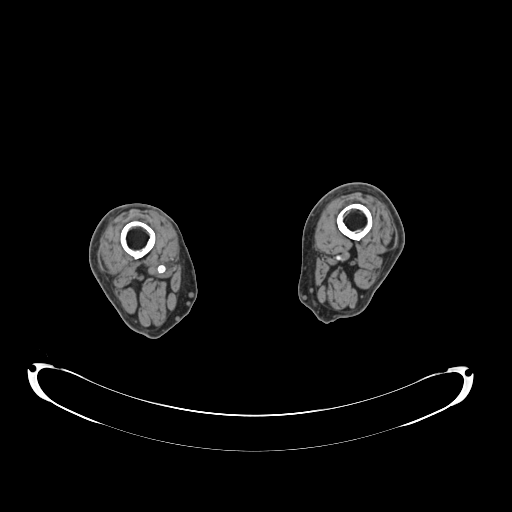
[im 15/212  bone]
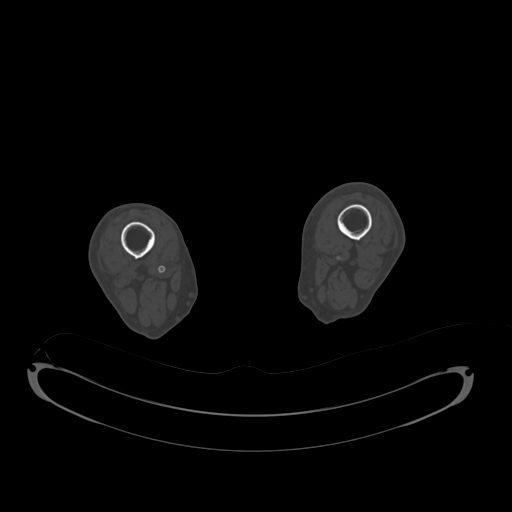
[im 29/212  soft-tissue]
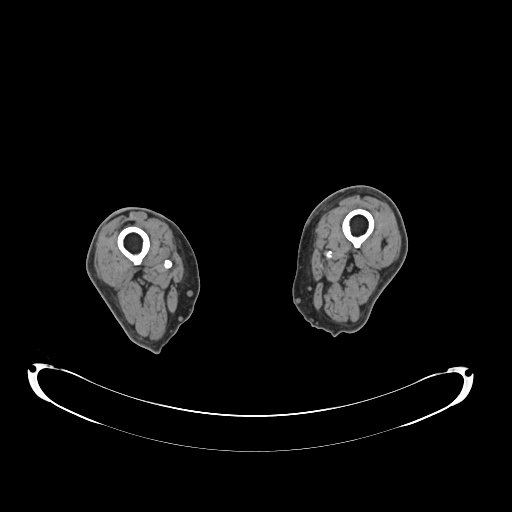
[im 43/212  soft-tissue]
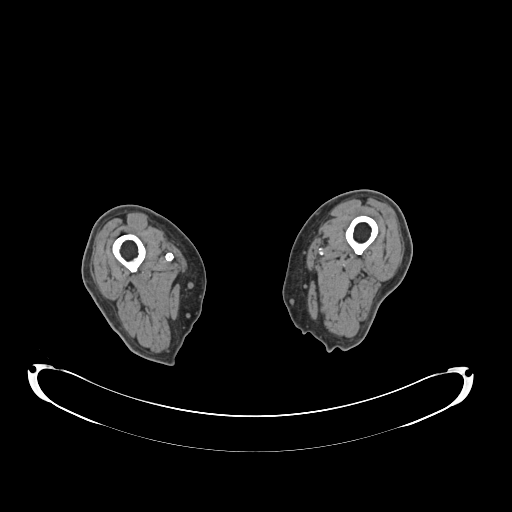
[im 57/212  soft-tissue]
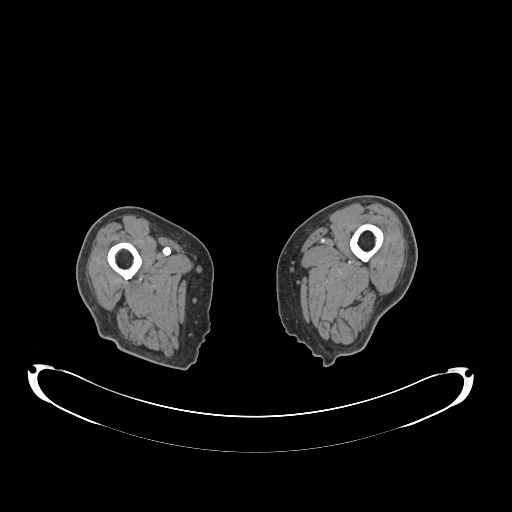
[im 71/212  soft-tissue]
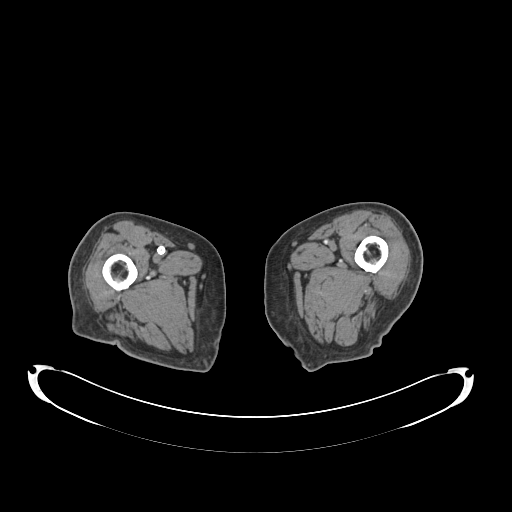
[im 85/212  soft-tissue]
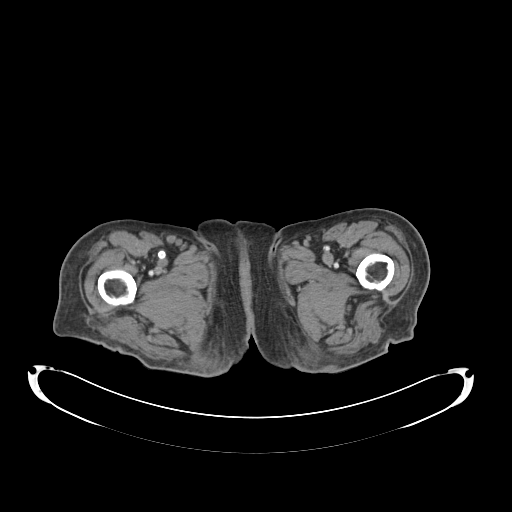
[im 113/212  soft-tissue]
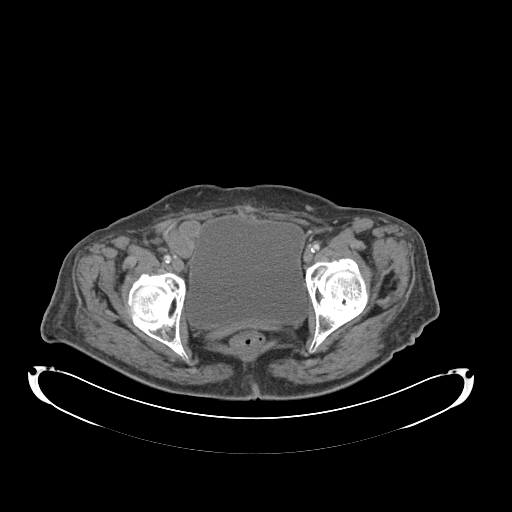
[im 127/212  soft-tissue]
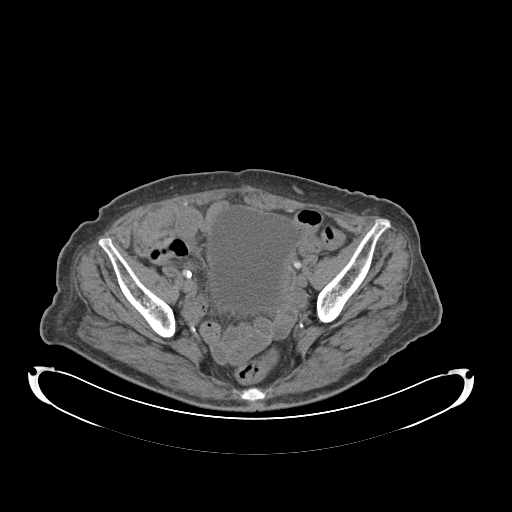
[im 141/212  soft-tissue]
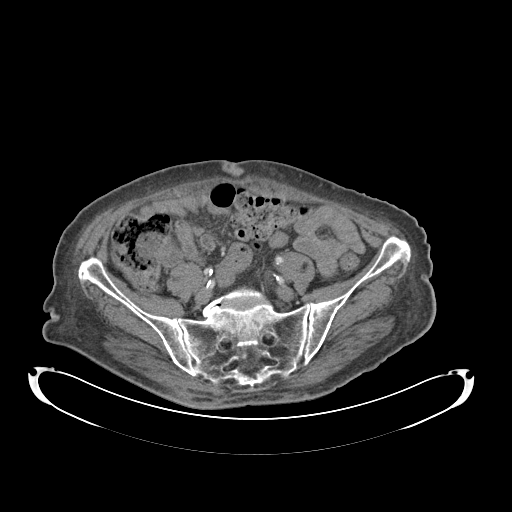
[im 141/212  bone]
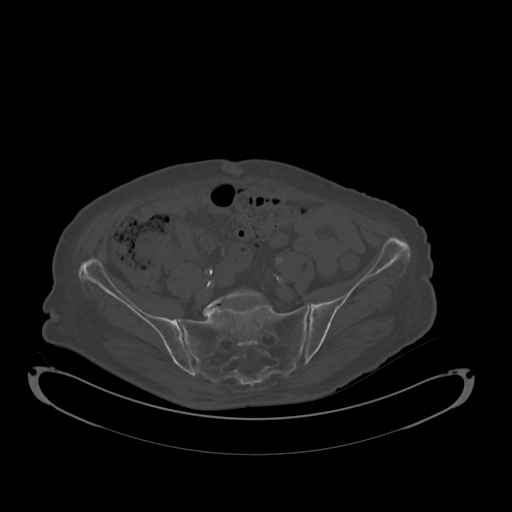
[im 155/212  soft-tissue]
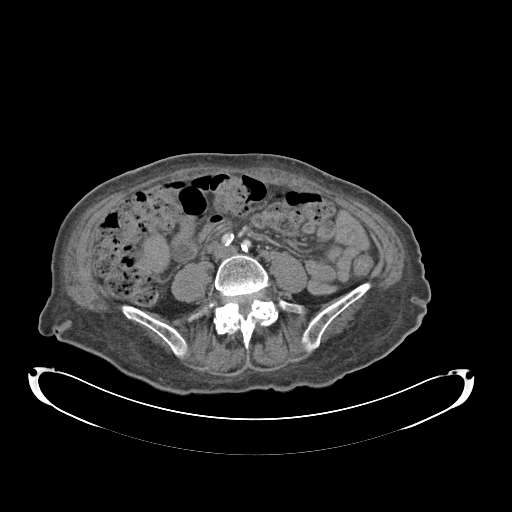
[im 155/212  lung]
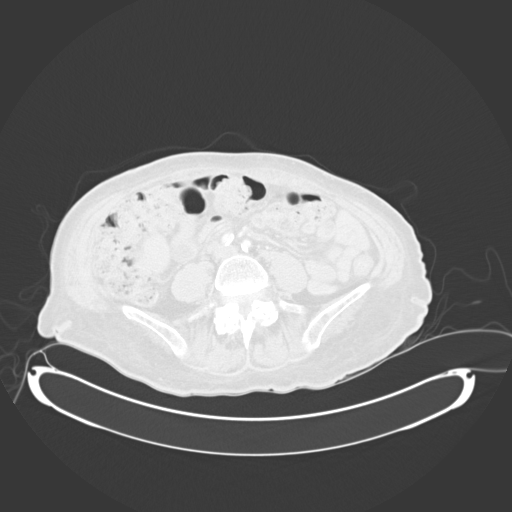
[im 169/212  soft-tissue]
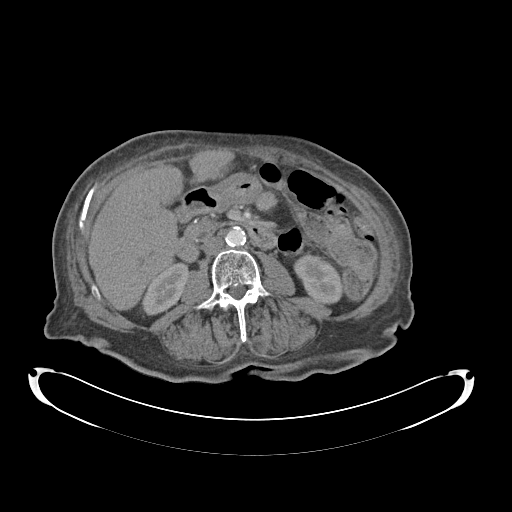
[im 169/212  lung]
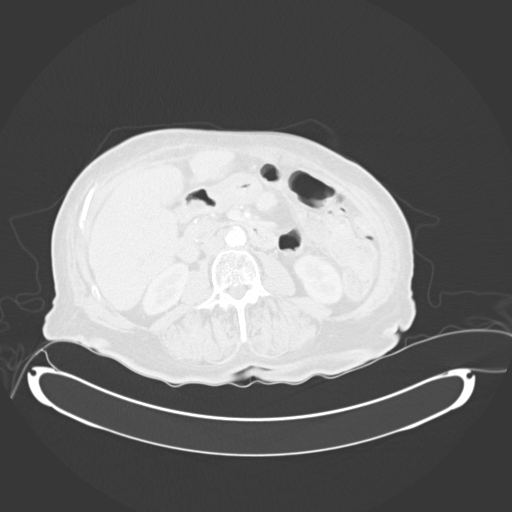
[im 183/212  soft-tissue]
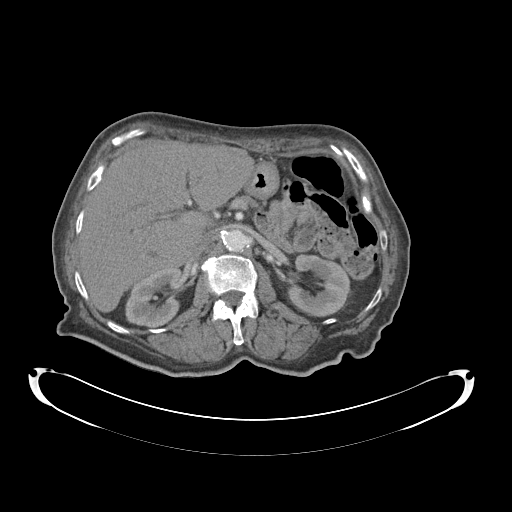
[im 183/212  lung]
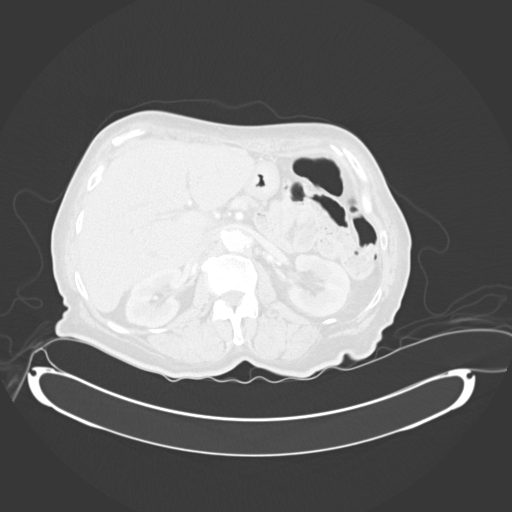
[im 197/212  soft-tissue]
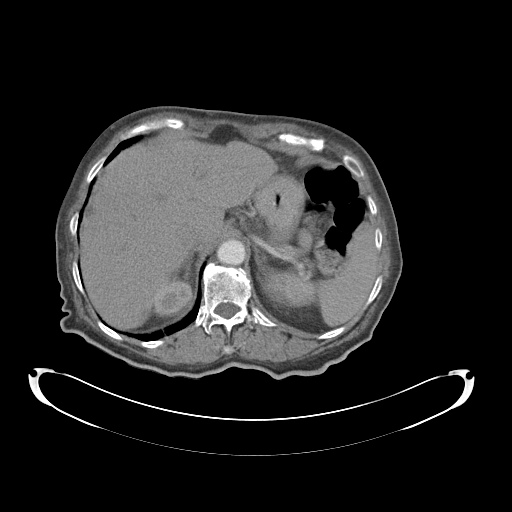
[im 197/212  lung]
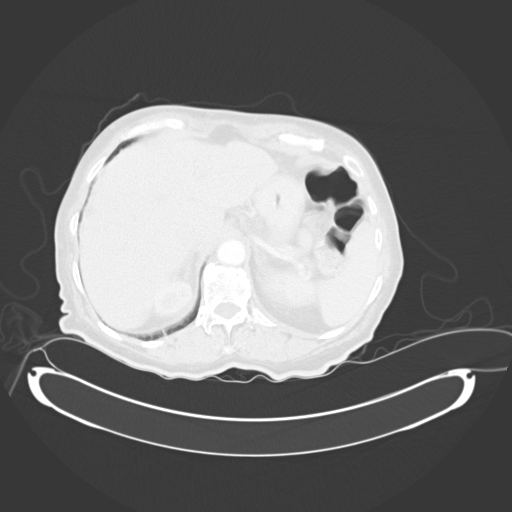

[13 of 32 positions shown; findings below may reference images not displayed]

FINDINGS: Standard CTA obtained 115 cc of [id number]. Axial source images,
MIP images, volume rendered images reviewed. A bilateral symmetric renal
perfusion is present. Bilateral single major renal arteries are patent with
origin calcific stenoses. Dome aorta is patent with diffuse episodic
vascular disease. Multifocal common external iliac artery disease present
with multifocal prominent stenoses.

On the righ common femoral and deep femoral arteries are patent. Right SFA
stents appear to be present. Evaluation of flow within the stent is
difficult. No definite contrast is noted within the stented right SFA
suggestingsuggesting occlusion. Popliteal and trifurcation vessels are
patent with multifocal disease.

On the left the common femoral, SFA, deep femorals are patent with
multifocal prominent disease particularly at the adductor hiatus. Popliteal
artery and trifurcation vessels are patent multifocal disease.
IMPRESSION: Probable complete right SFA stent occlusion. Right
popliteal /trifurcation vessels patent.

## 2014-05-16 NOTE — Consult Note (Signed)
Brief Consult Note: Diagnosis: Cognitive disorder NOS, Delirium secondary to medical condition.   Patient was seen by consultant.   Consult note dictated.   Recommend further assessment or treatment.   Orders entered.   Comments: [name] has remote h/o depressive episode. Sghe recently came to ER for AMS most likely in the course offoot gangreene. She was well oriebnted and sensible with me during the interview but became delusional, agitated and confused just 20 min later.   PLAN: 1. She is on [employer]> Sitter ordered.   2. I will start low dose haldol for delirium and 2 mg of haldol for agitation.   3. I wil follow up.  Electronic Signatures: Alyssa [name], Alyssa [name] (MD)  (Signed 23-Dec-13 15:27)  Authored: Brief Consult Note   Last Updated: 23-Dec-13 15:27 by Alyssa [name], Alyssa [name] (MD)

## 2014-05-16 NOTE — Consult Note (Signed)
Details:    - Psychiatry: Came by to see patient today. She was in a good mood. She was dressed in normal streett clothes and had cleaned herself up. She was articulate and appropriate in conversation. No sign of thought disorder. Expressed her motivation to continue treatment. Denied any wish to die at all. Very motivated to continue with treatment. She showed a good understanding of her current treatment plan. I have taken her off of involuntary commitment and think that still has been a good decision. She does not need a sitter at this time. Will continue to follow as needed.   Electronic Signatures: Clapacs, Jackquline DenmarkJohn T (MD)  (Signed 27-Dec-13 20:24)  Authored: Details   Last Updated: 27-Dec-13 20:24 by Audery Amellapacs, John T (MD)

## 2014-05-16 NOTE — Op Note (Signed)
PATIENT NAME:  Alyssa [name], Alyssa [name] MR#:  [id number] DATE OF BIRTH:  July 17, 1940  DATE OF PROCEDURE:  01/12/2012  PREOPERATIVE DIAGNOSES:  1.  Peripheral arterial disease with ulceration, right lower extremity.  2.  Previous revascularization performed at outside institution.  3.  Hypertension.  4.  Stroke.  5.  Heart disease.   POSTOPERATIVE DIAGNOSES:  1.  Peripheral arterial disease with ulceration, right lower extremity.  2.  Previous revascularization performed at outside institution.  3.  Hypertension.  4.  Stroke.  5.  Heart disease.   PROCEDURES:  1. Ultrasound guidance for vascular access, left femoral artery.  2. Catheter placement into right popliteal artery from left femoral approach.  3. Aortogram and selective right lower extremity angiogram.  4. Percutaneous transluminal angioplasty of right above-knee popliteal artery and superficial femoral artery with 4 and 5 mm diameter angioplasty balloon.  5. Placement of two 5 mm diameter Viabahn covered stents to the right superficial femoral artery and above-knee popliteal artery for residual stenosis and thrombus after angioplasty.  6. StarClose closure device, left femoral artery.   SURGEON:  Annice Needy, MD  ANESTHESIA:  Local with moderate conscious sedation.   ESTIMATED BLOOD LOSS:  Approximately 25 mL.  FLUOROSCOPY TIME:  Approximately 10 minutes.   CONTRAST USED:  110 mL.   INDICATION FOR PROCEDURE:  This is a 74 year old female with severe ulcerations of the lower extremities. She has missed multiple previous appointments to our office. She had previously been seen at [facility] and was lost to followup there. She had had previous revascularization attempts done there. She has also seen the podiatrists. She is an extremely high risk of limb loss. She was admitted to the hospital for cellulitis and infection of her wounds and attempt at revascularization is performed as she desires an attempt at limb salvage at this  point. Risks and benefits were discussed. Informed consent was obtained.   DESCRIPTION OF THE PROCEDURE:  The patient was brought to the vascular interventional radiology suite. Groins were shaved and prepped, and a sterile surgical field was created. The left femoral head was localized with fluoroscopy and the left femoral artery was then accessed under direct ultrasound guidance without difficulty with a Seldinger needle and permanent image was recorded. A J-wire and 5-French sheath were placed. Pigtail catheter was placed in the aorta at the L1 level. AP aortogram was performed; this showed some calcific disease of the aorta and iliac segments without high-grade stenosis. There were patent renal arteries bilaterally. With the help of a Terumo Advantage wire. I advanced the pigtail catheter to the right femoral head and selective right lower extremity angiogram was then performed. This demonstrated a stent that terminated at the origin of the superficial femoral artery and was occluded. This stent traversed down to the distal superficial femoral artery and she reconstituted an above-knee popliteal artery. Her vessels were small. She did have 2-vessel runoff distally. The patient was systemically heparinized and a 6-French high flex Ansel sheath was placed over the Terumo Advantage wire. I was able to gain access to the occluded superficial femoral artery without difficulty with the Terumo advantage wire. I crossed the lesion without difficulty with the wire and a Kumpe catheter and confirmed intraluminal flow in the below-knee popliteal artery. I then replaced the wire. Initially, a 4 mm diameter angioplasty balloon was inflated from just above the knee to the common femoral artery. Following this, there was significant improvement in flow, but there were several areas of residual  stenosis and likely some chronic thrombosis as well, one at the distal endpoint near Hunter's canal and another more proximally  within the SFA stent. A 5 mm balloon was inflated over much of these areas, but there was still residual stenosis and thrombosis in the proximal to mid SFA within the stent, as well as at the distal endpoint and into the above-knee popliteal artery. For these reasons, I exchanged to a 0.018 wire and deployed 5 mm Viabahn stents encompassing both these lesions and continuing to the proximal superficial femoral artery, one was 10 cm in length and one was 15 cm in length. This was ironed out with a 5 mm balloon and this was done after I had exchanged to an 0.018 wire. The completion angiogram following this showed markedly improved flow. There was some very mild residual thrombus in the popliteal artery that did not appear to be flow limiting, and I elected to treat this with Integrilin only. The runoff was preserved distally. At this point, I elected to terminate the procedure. The sheath was pulled back to the ipsilateral external iliac artery and oblique arteriogram was performed. StarClose closure device deployed in the usual fashion with excellent hemostatic result. The patient tolerated the procedure well and was taken to the recovery room in stable condition.    ____________________________ Annice NeedyJason S. Dew, MD jsd:es D: 01/12/2012 10:59:27 ET T: 01/13/2012 10:27:23 ET JOB#: [id number]  cc: Annice NeedyJason S. Dew, MD, <Dictator> Annice NeedyJASON S DEW MD ELECTRONICALLY SIGNED 01/16/2012 18:37

## 2014-05-16 NOTE — Consult Note (Signed)
Impression: 74yo female w/ h/o DM, PVD with ulceration who was admitted for psychosis.  Her psychiatric condition has stabilized.  In looking at her RLE, there does not appear to be a great deal of cellulitic changes.  She has two small ulcer with eschar present anteriorly and laterally on the lower leg.  The heel has a larger ulcer with heaped up debris and the dorsal aspect of the second toe has an eschar on it.  There is no drainage from any of the wounds.  There is only minimal erythema around the heel wound and the other wounds are not erythematous.  She states that the culture in the ER was taken from the 2nd toe wound.  As this is a closed wound with an eschar present, the culture results are not helpful.  She has no fever and her WBC is normal. This appears to be PVD with chronic ulceration without any evidence of acute infection.  I spoke with Dr. Wyn Quakerew who has been treating her ulcers (most recently two weeks ago).  He did not feel that they were any worse than before.  He would like to perform further outpt testing in his office to evaluate her blood flow through her stents. I would not treat this with antibiotics.  The multiple organisms present are representative of what is on the surface of her skin. She will follow up with vascular surgery as an outpatient.  If she has clotted the stents and no further intervention is possible, she may need BKA as the wounds would not likely heal.  If prior to that she developed drainage from the wounds (wet gangrene), then I would consider IV antibiotics prior to BKA.  Electronic Signatures: Tulio Facundo, Rosalyn GessMichael E (MD)  (Signed on 27-Dec-13 15:47)  Authored  Last Updated: 27-Dec-13 15:47 by Amiel Sharrow, Rosalyn GessMichael E (MD)

## 2014-05-16 NOTE — H&P (Signed)
PATIENT NAME:  Alyssa [name], Alyssa [name] MR#:  [id number] DATE OF BIRTH:  09/11/1940  DATE OF ADMISSION:  01/19/2012  PRIMARY CARE PHYSICIAN:  Marisue IvanKanhka Linthavong, MD  REFERRING PHYSICIAN:  Glennie IsleSheryl Gottlieb, MD  VASCULAR SURGEON:  Festus BarrenJason Dew, MD  PODIATRY:  Linus Galasodd Cline, DPM  PSYCHIATRIST:  Annett Gulaerry Clapacs, MD  CHIEF COMPLAINT:  Hyperglycemia and right foot ulcer.   HISTORY OF PRESENT ILLNESS:  The patient is [name] 74 year old Caucasian female with [name] past medical history of diabetes mellitus with nephropathy and nephropathy, history of poor compliance, hypertension, hyperlipidemia, COPD with ongoing tobacco abuse, peripheral vascular disease with recent stent placement on the left lower extremity during the previous admission and the patient is on Plavix, history of degenerative joint disease, coronary artery disease who was brought into the ER via cops.  The patient's son has called police as she was behaving weird and became psychotic. According to the ER physician's history, the patient has called 911 several times stating that she has [name] methamphetamine lab behind her home.  Police had been to her home several times and they could not find any lab behind her home.  Today, her son has called the police stating that she is quite psychotic. The patient was brought into the ER with cops and initially she was evaluated by ER physician for behavioral problems.  The patient was found to be hyperglycemic with elevated blood sugar that was at 450 initially. Also, they have noticed cellulitis of the right lower extremity and possible ganglion. ER physician, Dr. Mindi JunkerGottlieb, has called the vascular surgeon on call, Dr. Gilda CreaseSchnier, regarding the right lower extremity foot ulcer with possible gangrene. Dr. Gilda CreaseSchnier, who is on call, has recommended to admit the patient to hospitalist service in view of hyperglycemia.  During my examination, the patient is reporting that her ulcers look much better than before. She is also asking me to  release her to home as tomorrow is her 71th birthday. The plan is to make her an IVC by the ER physician and the patient eventually needs to be seen by [name] psychiatrist regarding her psychosis. The patient has received 1 dose of vancomycin in the ER regarding the cellulitis with possible gangrene.  The patient is also reporting that she is supposed to get wound care at home from tomorrow onwards. No family members were available at bedside. She was quite abusive verbally to the ER staff as reported by the ER nursing. During my examination, the patient is just asking me to release her to go home to celebrate her 71th birthday with her husband tomorrow. She denies any chest pain or shortness of breath. She was just reporting that her right foot ulcers looked much better than before and she does not think they are infected.   PAST MEDICAL HISTORY: 1.  Peripheral vascular disease with recent stenting of the left lower extremity and the patient is on Plavix.   2.  Diabetes mellitus with neuropathy and nephropathy with apparent history of poor compliance.  3.  Hypertension.  4.  Hyperlipidemia.  5.  History of COPD with ongoing tobacco abuse.  6.  History of degenerative joint disease.  7.  Coronary artery disease with unclear details.   PAST SURGICAL HISTORY: 1.  Status post hysterectomy. 2.  Status post stent placement for peripheral vascular disease in the left lower extremity recently.  3.  Status post cholecystectomy.  4.  Appendectomy.   ALLERGIES:  1.  CARDIZEM. 2.  PENICILLIN.  3.  CYMBALTA.  4.  ANTI-INFLAMMATORIES.  HOME MEDICATIONS:  Demerol 50 mg p.o. q. 6 hours, Prevacid 30 mg p.o. at bedtime, polyethylene glycol 17 grams p.o. once [name] day, NovoLog sliding scale 3 times [name] day, Lantus insulin 10 units subcutaneous at bedtime as reported by the patient, though it states 24 units subcutaneously once daily according to the ER records, hydrochlorothiazide 12.5 mg p.o. once [name] day, gabapentin 300  mg 3 times [name] day, clopidogrel 75 mg once [name] day. Tylenol 500 mg 2 tablets every 8 hours.   PSYCHOSOCIAL HISTORY: Lives at home, continues to smoke. Denies any alcohol or illicit drug usage.   FAMILY HISTORY: History of stroke and coronary artery disease runs in her family.  REVIEW OF SYSTEMS:  CONSTITUTIONAL: Denies any fever, fatigue, weakness, pain, weight loss or weight gain.  EYES: Denies any blurry vision, glaucoma, cataracts.  ENT: Denies tinnitus, ear pain, hearing loss, snoring, postnasal drip, sinus pain, difficulty in swallowing.  RESPIRATORY: Denies cough, wheezing or hemoptysis. Positive COPD. Denies any asthma.  CARDIOVASCULAR: Denies chest pain, orthopnea, orthopnea, palpitations, syncope, varicose veins.  GASTROINTESTINAL: Denies nausea, vomiting, diarrhea, abdominal pain, constipation, rectal bleeding, hematemesis or melena.  GENITOURINARY: Denies dysuria, hematuria, renal calculi. GYNECOLOGIC AND BREASTS:  Denies any breast mass or vaginal discharge.  ENDOCRINE: Denies polyuria, polyphagia, polydipsia, thyroid problems, increased sweating.  HEMATOLOGIC/LYMPHATIC:  Denies anemia, easy bruising or swollen glands.  MUSCULOSKELETAL: Denies any pain in the neck, back, shoulder or limited activity.  NEUROLOGIC: Denies any vertigo, ataxia, dementia, headache or migraine.  PSYCHOLOGIC: Denies any insomnia, depression, nervousness.   PHYSICAL EXAMINATION: VITAL SIGNS: Temperature 98.3, pulse 71, respiratory rate 16, blood pressure 164/75, pulse ox was 99% on room air.  GENERAL APPEARANCE: The patient is not in any acute distress, moderately built and moderately nourished.  HEENT:  Normocephalic, atraumatic. Pupils are equally reacting to light and accommodation. No conjunctival injection.  Extraocular movements are intact. Cooperative. No rash. No discharge from the ears.  No postnasal drip. No pharyngeal edema.  NECK: Supple. No JVD. No thyromegaly and no carotid bruits.  LUNGS:  Clear to auscultation bilaterally. No accessory muscle usage.  No anterior chest wall tenderness.  CARDIAC: S1, S2 normal. Regular rate and rhythm. Point of maximum impulse is intact. No peripheral edema.  Peripheral pulses are 1+ on the right lower extremity and 1 to 2 on the left lower extremity.  GASTROINTESTINAL: Soft. Bowel sounds are positive in all 4 quadrants. Nontender and nondistended. No masses felt.  NEUROLOGIC: Awake, alert, and oriented x3, cooperative. Reflexes are 2+.  Motor and sensory are grossly intact.  MUSCULOSKELETAL: No joint effusion, swelling, edema or tenderness is noticed. PSYCHIATRIC:  The patient does seem to be agitated and anxious.  EXTREMITIES: No cyanosis, no clubbing.  In the left groin area, the wound is covered with [name] Band Aid and healing well.  The right lower extremity and the right second toe, [name] discoloration is noticed approximately 2 x 2 cm ulcer, but no discharge is noticed.  On the mid dorsum of the foot there is [name] 1 x 1 cm superficial ulceration noticed with scabbing.  On the back of the foot, [name] 4 cm x 2 cm x 1 cm deep ulcer is noticed with no discharge but darker discoloration in the middle of the ulcer.  The right foot is edematous, nontender.  LABORATORIES AND IMAGING STUDIES:  Accu-Cheks are elevated at 452 and 421. Serum glucose was 470, BUN 19, creatinine 1.08, sodium  is 135, potassium 3.4, chloride is 99, CO2  26, anion gap 10, serum osmolality 293, GFR 52, total protein 8.0, albumin 2.4, bilirubin total 0.3, alkaline phosphatase 281, AST 18, ALT 27. TSH is 1.32.  Urine drug screen is negative. WBC 8.5, hemoglobin 11.9, hematocrit 35.5, platelet count 384,000. UA glucose greater than 500, bilirubin negative, ketones negative, leukocyte esterase negative, nitrites negative, epithelial cells 1.  Serum acetaminophen is 2, salicylate level is less than 1.7.   ASSESSMENT AND PLAN:  This is [name] 74 year old Caucasian female brought into the Emergency Room by police  for behavioral problems.  Will be admitted to medical services with the following assessment and plan:  1.  Right foot diabetic ulcer with cellulitis and probable gangrene.  Plan:  We will do wound culture and sensitivity.  Give her vancomycin intravenous, pharmacy to dose. We will consult vascular surgery.  [name] call was placed and discussed with Dr. Gilda Crease on call, by ER physician; they are aware of the consult.  2.  Insulin-dependent diabetes mellitus which is uncontrolled with hyperglycemia.  Will give Lantus 10 units subcutaneous x1 stat and continue high-dose insulin sliding scale and titrate Lantus dose as needed.  3.  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  I will consult psychiatry. The patient will be on 1-on-1 observation with bathroom privileges. 4.  Hypertension.  Will continue home medications except hydrochlorothiazide, which will be on hold.  5.  Peripheral vascular disease with recent stent placement in the left lower extremity. We will continue her Plavix.  6.   Chronic obstructive pulmonary disease, stable.  We will provide her DuoNeb treatments.  7.  Will provide the patient with gastrointestinal and deep vein thrombosis prophylaxis with Protonix and Lovenox subcutaneous. 8.  Consults were placed to vascular surgery and psychiatry.  The plan of care was discussed with the patient.   Total Time Spent on the admission: 60 minutes.  The patient will be turned over to the [facility] group in [name].   ____________________________ Ramonita Lab, MD ag:ct D: 01/19/2012 00:30:06 ET T: 01/19/2012 07:06:18 ET JOB#: [id number]  cc: Ramonita Lab, MD, <Dictator> Marisue Ivan, MD  Ramonita Lab MD ELECTRONICALLY SIGNED 01/21/2012 7:09

## 2014-05-16 NOTE — Consult Note (Signed)
Patient with sevral right foot wounds.  Significant pain and mild erythema.  Suspect cellulitis is present as well and underlying deep tissue infection/osteo could certainly be present.  Podiatry is going to see as well and she has seen them in the past.a vascular standpoint, has known PAD.  [facility] and reports having had previous intervention which has failed.  She will need an angiogram at some point to define anatomy and potentially provide endovascular revascularization options.  risk of limb loss is high, and we discussed that today.  We discussed an amputation would be preferable to chronic pain if her vascular disease is not reconstructible.  We discussed that we would explore all appropriate options for limb salvage prior to any amputation though. Will schedule for angiogram on Monday.    Electronic Signatures: Annice Needyew, Gwendelyn Lanting S (MD)  (Signed on 12-Dec-13 09:07)  Authored  Last Updated: 12-Dec-13 09:07 by Annice Needyew, Oshae Simmering S (MD)

## 2014-05-16 NOTE — Consult Note (Signed)
Brief Consult Note: Diagnosis: Delirium resolved.   Patient was seen by consultant.   Consult note dictated.   Recommend to proceed with surgery or procedure.   Comments: Psychiatry: Patient seen. Consult dictated. Patient currently lucid and euthymic. No longer delirious. Needs no specific psych treatment. Continue prn. Can Personnel officerdc sitter.  Electronic Signatures: Milarose Savich, Jackquline DenmarkJohn T (MD)  (Signed 13-Dec-13 15:45)  Authored: Brief Consult Note   Last Updated: 13-Dec-13 15:45 by Audery Amellapacs, Necha Harries T (MD)

## 2014-05-16 NOTE — Consult Note (Signed)
PATIENT NAME:  Alyssa [name], Alyssa [name] MR#:  [id number] DATE OF BIRTH:  1940/04/26  DATE OF CONSULTATION:  01/08/2012  CONSULTING PHYSICIAN:  Linus Galasodd Kavion Mancinas, DPM  REASON FOR CONSULTATION: This is [name] with [name] history of diabetes and neuropathy with poor compliance who is known to our practice outpatient, seen by Dr. Ether GriffinsFowler. She has had [name] nonhealing ulceration on the posterior aspect of her right leg over the Achilles tendon. She recently was admitted for pain in the right leg as well as some altered mental status over the last couple of weeks.   PAST MEDICAL HISTORY:  1. Diabetes with neuropathy, history of poor compliance.  2. Nephropathy.  3. Hypertension.  4. Hyperlipidemia.  5. Chronic obstructive pulmonary disease with ongoing tobacco abuse.  6. Peripheral vascular disease with prior stenting.  7. Degenerative disk disease.  8. Heart disease.   PAST SURGICAL HISTORY:  1. Prior vascular intervention lower extremity.  2. Status post hysterectomy.  3. Status post cholecystectomy.  4. Status post appendectomy.   MEDICATIONS:  1. Losartan 25 mg p.o. b.i.d.  2. Hydrochlorothiazide 25 mg p.o. daily.  3. Lantus, unknown dose.  4. Neurontin 600 mg p.o. t.i.d.  5. Omeprazole 20 mg p.o. daily.  6. Requip, last dose 0.75 mg p.o. b.i.d.   ALLERGIES: CARDIZEM, PENICILLIN, CYMBALTA AND ANTI-INFLAMMATORIES.   FAMILY HISTORY: Heart disease, stroke.   SOCIAL HISTORY: She continues to smoke. She does not relate any alcohol use.   REVIEW OF SYSTEMS: Unable to obtain. The patient is barely cognizant at this point.   PHYSICAL EXAMINATION:  VASCULAR: DP pulse is trace at best bilateral. PT pulse could not be palpated. Delayed capillary refill into the digits, especially on the right foot.   NEUROLOGICAL: Sensation appears to be grossly intact.   INTEGUMENT: The skin is cool, dry, and atrophic. There is some erythema and edema in the right foot as compared to the left. Absent hair  growth. Full thickness ulceration approximately 2.5 cm x 1.5 cm over the posterior aspect of the ankle down to the level of exposed tendon. There does not appear to be any significant purulent drainage. Also is an intact eschar over the dorsum of the right second toe. There does not appear to be any drainage from that area as well.   MUSCULOSKELETAL: Stiff and limited range of motion of the pedal joints.   X-RAYS: Multiple views taken of the right ankle reveal diffuse osteopenia. There is [name] large ulcerative area noted on x-ray over the posterior aspect of the ankle over the Achilles tendon. Underlying the ulcerative area, there is [name] significant calcification within the body or just inferior to the Achilles tendon. It does not appear to be [name] fracture fragment, possibly old injury. No clear evidence of fracture in the foot or ankle.   IMPRESSION:  1. Diabetes with significant peripheral vascular disease, right foot.  2. Full thickness ulceration, right ankle.   PLAN: We will begin wet-to-dry dressings over the ulceration on the ankle. I spoke with Dr. Wyn Quakerew earlier today who states she has angioplasty scheduled for Monday. We will wait to see the results of that. She is at high risk for limb loss. Just daily wound care until revascularization attempt.  ____________________________ Linus Galasodd Lisamarie Coke, DPM tc:cbb D: 01/08/2012 14:17:13 ET T: 01/08/2012 17:55:08 ET JOB#: [id number] Kanijah Groseclose DPM ELECTRONICALLY SIGNED 01/27/2012 14:58

## 2014-05-16 NOTE — Consult Note (Signed)
Details:    - Psychiatry: After meeting with patient I do not believe she is an acute danger to self or others and is not acutely mentally ill and does not meet criteria for IVC. IVC can be discontinued and sitter discontinued as well.   Electronic Signatures: Clapacs, Jackquline DenmarkJohn T (MD)  (Signed 26-Dec-13 13:55)  Authored: Details   Last Updated: 26-Dec-13 13:55 by Audery Amellapacs, John T (MD)

## 2014-05-16 NOTE — Consult Note (Signed)
PATIENT NAME:  Alyssa [name], Alyssa [name] MR#:  [id number] DATE OF BIRTH:  29-Mar-1940  DATE OF CONSULTATION:  01/09/2012  IDENTIFYING INFORMATION AND REASON FOR CONSULT: A 74 year old woman with a history of diabetes and arthritis who was admitted to the hospital because of altered mental status along with a foot ulcer and cellulitis. Consult for evaluation of mental state and treatment recommendation.   HISTORY OF PRESENT ILLNESS: Information obtained from the patient and the chart. I came by to see the patient yesterday but she was so soundly asleep I was unable to arouse her. Came by to see her again today. According to the chart when she came into the hospital on 01/07/2012 she was extremely agitated and confused and combative. Was trying to leave the hospital despite being obviously medically sick. Required sedation to control her behavior because of acute danger to self. She was then sedated with significant amounts of Ativan yesterday.   On evaluation of her medical state patient had a serious infection in her right foot. Glucose is also elevated. Multiple abnormalities of her electrolytes. Had opiate positive in her urine. Had a urinalysis consistent with a urinary tract infection. In short, she had multiple reasons to be delirious.   On evaluation today the patient was initially sleeping but was easily aroused. Once aroused she was able to engage in a lucid conversation. She reported that her mood was feeling okay other than being in a great deal of pain. She said she still had positive things in her life to look forward to and enjoyed her life. She denied feeling depressed. Totally denied suicidal or homicidal ideation. The patient was alert and oriented x4, had a short-term memory reasonably intact with memory of 2 out of 2 objects at a minute and was able to carry on a lucid conversation. Denied hallucinations. Did not appear to be delusional or agitated and did not make any statements about wanting to  leave the hospital. She told me she understood where she was and that she was here for possible vascular surgery and that she was feeling afraid about the possibility of losing her foot, but she appeared to be reacting to that appropriately. The patient required treatment with benzodiazepines and Haldol during her first day or so in the hospital because of her agitation, but that has been able to be tapered down, and she is no longer requiring frequent doses.   PAST PSYCHIATRIC HISTORY: The patient tells me that this summer when she was at [facility] she was transferred to Psychiatry briefly. She is somewhat vague about this. She denies that she was trying to kill herself but admits that she had made a statement about not wanting to live anymore. She clarifies to me now that that was simply saying that she did not want to live in terrible pain but did not indicate that she was trying to kill herself. Denies that she has ever tried to kill herself. Denies any other psychiatric treatment. Says that she been tried on Zoloft in the past but that it was not helpful. Could not remember any other medicine she has taken in the past. Denied any history of psychosis. She denied history of alcohol abuse. She said that she used to be dependent on morphine which was given to her for treatment of her chronic pain. She reports that she stopped taking it on her own earlier this year and has not had trouble staying off of it. I do note that she had a positive opiate drug  screen when she came into the hospital. It is not clear if she is currently being prescribed outpatient opiates.   SOCIAL HISTORY: The patient lives with her husband. She reports that her husband is demented. She admits that neither one of them have been in any shape to take care of the house recently and that it is in poor condition and that they have been eating poorly. She has an adult son who recently moved back from [place] who she says she is relying on to help  take care of her at this point. The patient evidently is educated, has worked Occupational psychologistprofessionally in the past.   PAST MEDICAL HISTORY: The patient has diabetes and is insulin dependent. Currently has an infection and probably gangrene in her right foot.  Has a urinary tract infection. Also has high blood pressure.   REVIEW OF SYSTEMS: Mainly complains of pain in her right foot. Denies feeling depressed, denies hopelessness. Says she is frightened about losing her foot. Denies suicidal or homicidal ideation. Denies any hallucinations.   MENTAL STATUS EXAMINATION: Somewhat disheveled sick-appearing woman interviewed in her hospital room. Eye contact good. Psychomotor activity normal given her medical condition. Speech normal rate, tone, and volume. Affect reactive, euthymic and appropriate. Mood stated as being okay. Thoughts are lucid without any loosening of associations. No sign of delusional thinking. Denies auditory or visual hallucinations. Denies suicidal or homicidal ideation. Shows good insight and judgment. Able to remember 2 out of 3 objects at 3 minutes. Alert and oriented x4. Baseline intelligence good.   ASSESSMENT: This is a 74 year old woman who yesterday was delirious when she was acutely sick. After treatment with rehydration and antibiotics, she has improved greatly. At this time she is not showing any signs of delirium to my examination. She is not expressing symptoms of depression. It sounds like she may have had some problems with overuse of opiates in the past. She is willing to admit some of this but it is not clear how much of an ongoing problem it is. It does sound like there have been serious social problems at home because of her being sick and her husband being sick. She is willing to admit to that right now. At this point I do not think there is any need for more specific psychiatric treatment. She is at risk for a return of delirium symptoms because of her ongoing medical problems. It  would be appropriate to continue having p.r.n. medicine available.   TREATMENT PLAN: No indication to start any psychiatric medication. I think Care Management should be involved as well as Adult Protective Services as is already being done and should include the adult son in discussing appropriate living situation once she is ready to go. Continue using the Haldol as a p.r.n. medicine if needed. Call Psychiatry again if any need arises. We will try to follow up next week if she is still on the hospital.   DIAGNOSIS PRINCIPLE AND PRIMARY:  AXIS I: Delirium due to medical condition, now resolved.   SECONDARY DIAGNOSES:  AXIS I: Rule out opiate abuse.  AXIS II: Deferred.  AXIS III: Gangrene, cellulitis, diabetes, chronic pain, high blood pressure.  AXIS IV: Severe from illness and the illness of her husband.  AXIS V: Functioning at time of evaluation 46.      ____________________________ Audery AmelJohn T. Clapacs, MD jtc:vtd D: 01/09/2012 15:58:00 ET T: 01/10/2012 07:20:43 ET JOB#: [id number]  cc: Audery AmelJohn T. Clapacs, MD, <Dictator> Audery AmelJOHN T CLAPACS MD ELECTRONICALLY SIGNED 01/12/2012 10:00

## 2014-05-16 NOTE — H&P (Signed)
PATIENT NAME:  Alyssa [name], Alyssa [name] MR#:  [id number] DATE OF BIRTH:  1940-11-15  DATE OF ADMISSION:  01/07/2012  CHIEF COMPLAINT: Right foot ulceration with altered mental status.   HISTORY OF PRESENT ILLNESS: 74 year old female with history of diabetes with nephropathy and neuropathy, with poor medical compliance, who has had issue with an ulceration on her right foot, nonhealing. She has been evaluated by podiatry and been evaluated by vascular surgery. Per report she has been followed at [facility] earlier this year with stenting, which unfortunately did not help. Family reports that she had been set up for surgery, which sounds like bypass versus angioplasty, to help this wound. The family members have noticed that over the last week or so she has become more confused and agitated. She appears to have been more aggressive toward family members also. The foot appears to be more red and the ulceration may be larger, according to the family members. Report from the Emergency Room staff is that Adult Protective Services has been following and that the home situation is poor, with poor general hygiene, a fact that is backed up by patient's generalized disheveled state on evaluation here. The Emergency Room physician has committed her medically as the patient has been insistent on leaving for home despite the concern that foot was at risk for potentially needing amputation if it got any worse. After discussing with the patient she is willing to stay for now for IV therapy and additional treatment.   PAST MEDICAL HISTORY:  1. Diabetes with neuropathy and nephropathy, history of poor compliance.  2. Hypertension.  3. Hyperlipidemia.  4. History of chronic obstructive pulmonary disease with ongoing tobacco abuse.  5. Peripheral vascular disease with prior stenting.  6. History of degenerative disk disease versus degenerative joint disease.  7. Status post hysterectomy. 8. Status post cholecystectomy.  9. Status  post appendectomy.  10. History of coronary artery disease, details unclear.   ALLERGIES: Chart lists allergies to Cardizem, penicillin, Cymbalta, and anti-inflammatories.   MEDICATIONS:  1. Losartan 25 mg p.o. b.i.d.  2. HCTZ 25 mg p.o. daily.  3. Lantus, dose unknown.  4. Neurontin 600 mg p.o. t.i.d.  5. Omeprazole 20 mg p.o. daily.  6. Requip, last dose 0.75 mg p.o. b.i.d., patient reporting taking 1 mg p.o. b.i.d.   SOCIAL HISTORY: Continues to smoke. No alcohol is reported.   FAMILY HISTORY: Stroke and coronary artery disease, per report.   REVIEW OF SYSTEMS: Please see history of present illness. Extremely limited from the patient. Patient says she takes her medication, husband and family members are sitting there telling me she does not take the medications much at all other than her pain medication. Unclear that she has had any fevers, chills. She denies current chest pain. She has dyspnea chronically, does not feel that this has changed. Denies bowel or bladder changes. Remainder complete review of systems is negative, otherwise is limited secondary to cooperation with patient due to her confusion.   PHYSICAL EXAMINATION:  VITAL SIGNS: Temperature 98.6, pulse 64, blood pressure 136/82, saturation 100% on room air, weight 57 kg.   GENERAL: Disheveled female, in no distress.   EYES: Pupils round and reactive to light. Lids and conjunctivae unremarkable.   EARS, NOSE, AND THROAT: Unremarkable. The oropharynx is slightly dry without lesions.   NECK: Supple. Trachea in the midline. No thyromegaly.   CARDIOVASCULAR: Regular rate and rhythm without gallops, rubs. Carotid and radial pulses 2+. Pulses not palpable in left foot.   LUNGS: Few  crackles without wheeze or retractions.   ABDOMEN: Soft, nontender, nondistended. Positive bowel sounds. No guarding, rebound.   SKIN: Erythema around the left foot with excoriations along the feet across the tops of the toes. There is a deeper  ulceration along the Achilles tendon, and the tendon appears to be visible in the base of the wound. Tissue around it is darkened but appears to be more unclean rather than necrotic. There is no odor or necrosis present.   LYMPH NODES: No cervical or supraclavicular nodes.   MUSCULOSKELETAL: No clubbing is seen. There is some erythema and some swelling in the left foot. The abrasions are noted as above.   NEUROLOGICAL: Decreased light touch present in the feet bilaterally. Gait is not tested.   LABORATORY, DIAGNOSTIC, AND RADIOLOGICAL DATA: CT of the head reveals no acute changes. Glucose 366, sedimentation rate 85. BUN 6, creatinine 0.73, potassium 2.9. Liver enzymes unremarkable except albumin 2.3. White count 8.5 with hemoglobin 12.6, platelets 231.  IMPRESSION AND PLAN: 1. Diabetic foot ulceration and probable cellulitis. Will place on IV vancomycin and Cipro for now. Consider adding Flagyl, but podiatry has seen the patient before and I will ask them to take a look at the foot to see whether they think this is infection or whether some of the changes that we are seeing are more persistent from chronic wound. Her elevated sedimentation rate suggests there is deeper involvement. I will hold on imaging until podiatry has seen her and give us a better idea of what they would like. Will ask vascular surgery to see the patient as well given the report that she was to be revascularized, and I suspect this will not heal unless this is performed. The potential for the patient losing her foot if this is not treated was stressed to her in the presence of her family members.  2. Diabetes mellitus. Will cover with sliding scale insulin. Add low dose Lantus, although home dose is unclear. Again, it is not clear whether she is taking any of her medications. 3. Hypertension. Will add losartan but put at 50 mg daily and follow blood pressure. Will hold HCTZ given the hypokalemia.  4. Neuropathy. Hold Neurontin for  now pending we see what her mental status looks like.  5. Agitation. It appears there may be some underlying psychiatric illness based on the reports of the family members of hoarding behavior, and will ask psychiatry to see the patient for assistance in proper management for this as well as for agitation. Will hold Haldol for now as we are placing her on Cipro. Will use of lorazepam as needed, watching for sedation.      6. Nicotine abuse. Nicoderm patch is applied.   ____________________________ Curtis SitesBert J. Raniah Karan III, MD bjk:cms D: 01/07/2012 20:42:28 ET T: 01/08/2012 06:07:26 ET JOB#: [id number]  cc: Curtis SitesBert J. Julieta Rogalski III, MD, <Dictator>  Daniel NonesBERT Dare Sanger MD ELECTRONICALLY SIGNED 01/08/2012 12:24

## 2014-05-16 NOTE — Consult Note (Signed)
Details:    - Psychiatry: Patient seen. She is alert and oriented and calm. Has good insight into her illness. I explained the pic line and she understood and expressed total agreement to get the procedure done. Affect appropriate. No sign of psychosis. Not delirious and no worsening of what is very minor dementia if any. o need for psych treatment. Supportive and educational therapy done.   Electronic Signatures: Clapacs, Jackquline DenmarkJohn T (MD)  (Signed 26-Dec-13 12:28)  Authored: Details   Last Updated: 26-Dec-13 12:28 by Audery Amellapacs, John T (MD)

## 2014-05-16 NOTE — Consult Note (Signed)
Brief Consult Note: Diagnosis: ASO with multiple ulcers right leg.   Patient was seen by consultant.   Recommend to proceed with surgery or procedure.   Orders entered.   Comments: will get CT angio of the right leg to assess the stents placed recently.  If there appears to be problems with the recent intervention then angiography will be needed.  Electronic Signatures: Levora DredgeSchnier, Alga Southall (MD)  (Signed 23-Dec-13 18:16)  Authored: Brief Consult Note   Last Updated: 23-Dec-13 18:16 by Levora DredgeSchnier, Donny Heffern (MD)

## 2014-05-16 NOTE — Consult Note (Signed)
Brief Consult Note: Diagnosis: delirium by history.   Patient was seen by consultant.   Orders entered.   Comments: Psychiatry: Came to see patient. Reviewed chart and spoke with care management about her home situation. Currently the patient is unconcious and I could not arouse her even with gentle shaking and calling her name. I will dc the standing ativan and order haldol 1mg  IV q1hr prn agitation. Will follow up later once she is able to interact.  Electronic Signatures: Audery Amellapacs, Magdelene Ruark T (MD)  (Signed 12-Dec-13 11:56)  Authored: Brief Consult Note   Last Updated: 12-Dec-13 11:56 by Audery Amellapacs, Avril Busser T (MD)

## 2014-05-19 NOTE — Consult Note (Signed)
PATIENT NAME:  Alyssa [name], Alyssa [name] MR#:  [id number] DATE OF BIRTH:  14-Oct-1940  DATE OF CONSULTATION:  01/23/2012  REFERRING PHYSICIAN:  Hospitalist  CONSULTING PHYSICIAN:  Rosalyn GessMichael E. Zygmund Passero, MD  REASON FOR CONSULTATION: Peripheral vascular disease with ulcers.   HISTORY OF PRESENT ILLNESS: The patient is [name] with [name] past history significant for diabetes and peripheral vascular disease, status post stent placement with chronic acute arterial insufficiency ulcers who was admitted on 01/19/2012 with hyperglycemia and acute psychosis. The patient was noted to have ulcers in the left leg. These had been present for several months. She has been evaluated by Vascular Surgery at [facility] and received [name] stent placement. She has also been seen by Vascular Surgery in [facility] as recently as two weeks ago. There was some concern that her vascular supply continues to be insufficient and the stents may no longer be functional. She previously had pain in the right foot. This has decreased suggesting that there is still some degree of flow. The various wounds on her right leg have remained dry without purulent discharge. She has not had any red streaks going up her leg. She denies any fevers, chills, or sweats. On admission to the hospital, she was started on meropenem and vancomycin. She has been afebrile and her white count has been normal. [name] culture was obtained, labeled wound culture, that she states was taken from the dorsum of her second toe. There is an intact eschar over this wound. The culture is growing enterococcus methicillin-sensitive Staphylococcus aureus and Enterobacter species along with normal flora. She is unable to get an angiogram due to her psychiatric illness, and Vascular Surgery is planning on performing an outpatient evaluation after discharge.   ALLERGIES: NONSTEROIDALS, CARDIZEM and PENICILLIN.   PAST MEDICAL HISTORY:  1. Diabetes. She states her sugars are been worse  recently.  2. Peripheral vascular disease with several stent placed in the right lower extremity.  3. Diabetic neuropathy.  4. Hypertension.  5. Hypercholesterolemia.  6. COPD.  7. Degenerative joint disease.  8. Coronary artery disease.  9. Status post hysterectomy.  10.. Status post cholecystectomy.  11. Status post appendectomy.   FAMILY HISTORY: Positive for stroke and coronary artery disease.   SOCIAL HISTORY: She lives with her husband. She is [name] smoker.   REVIEW OF SYSTEMS: GENERAL: No fevers, chills, or sweats. No malaise.  HEENT: No headaches. She has some nasal congestion. No sore throat.  NECK: No stiffness. No swollen glands.  RESPIRATORY: No cough or shortness of breath. No sputum production.  CARDIAC: No chest pains or palpitations.  GASTROINTESTINAL: No nausea, no vomiting, no abdominal pain, no change in her bowels.  GENITOURINARY: No change in her urine.  MUSCULOSKELETAL: No joint swelling or pain.  SKIN: She has had chronic ulcerations of her right lower extremity. No other rashes.  NEUROLOGIC: No focal weakness.  PSYCHIATRIC: She was psychotic on initial presentation, but this appears to have improved. All other systems are negative.   PHYSICAL EXAMINATION:  VITAL SIGNS: T-max of 99.0, T-current of 97.8, pulse 70, blood pressure 104/52, 100% on room air.  GENERAL: [name] 74 year old white female in no acute distress.  HEENT: Normocephalic, atraumatic. Pupils equal, reactive to light. Extraocular motion intact. Sclerae, conjunctivae, and lids are without evidence for emboli or petechiae. Oropharynx shows no erythema or exudate. Teeth and gums are in fair condition.  NECK: Supple. Full range of motion. Midline trachea looked. No lymphadenopathy. No thyromegaly.  LUNGS: Clear to auscultation bilaterally with  good air movement. No focal consolidation.  HEART: Regular rate and rhythm without murmur, rub, or gallop.  ABDOMEN: Soft, nontender, and nondistended. No  hepatosplenomegaly. No hernia is noted.  EXTREMITIES: No evidence for tenosynovitis.  SKIN: No rashes. She has ulcers on the right lower extremity. There was approximately [name] 1 cm x 0.5 cm ulceration with eschar formation anteriorly. [name] similar ulcer with eschar formation was present on the lateral right leg. These had no surrounding erythema and appeared to be healing. The dorsum of the second toe had [name] black eschar present. There was minimal surrounding erythema. There was no purulent drainage from this area. The foot more proximally did not appear to be particularly edematous. The foot was cool to touch, however, over the right Achilles area. There was [name] large ulcer with heaped borders and debris present. The wound base was not able to be seen. There was no purulent drainage from this area. There was only minimal surrounding erythema around the edges of the wound, and there was no lymphangitic streaking. These wounds wee not particularly tender to touch. The left lower extremity had no ulcerations noted. There was no stigmata of endocarditis, specifically no Janeway lesions or Osler nodes.  NEUROLOGIC: The patient was awake and interactive, moving all four extremities.  PSYCHIATRIC: Mood and affect appeared normal.   LABORATORY, DIAGNOSTIC, AND RADIOLOGICAL DATA:  BUN 21, creatinine 1.00, bicarbonate 29, anion gap of 6. LFTs were unremarkable on admission except for an alkaline phosphatase of 281. White count from today was 5.4 with [name] hemoglobin 10.3, platelet count of 290, ANC of 2.3. White count on admission was 8.5. [name] wound culture is growing Enterobacter methicillin-sensitive Staphylococcus aureus and enterococcus. This source is not labeled but the patient states it was taken from the dorsum of her second toe. Urinalysis had greater than 500 glucose, 1+ blood, 100 mg/dL protein, negative nitrites, negative leukocyte esterase. Right foot films demonstrated no bony abnormality. Attempted CT angiogram  showed possible complete right SFA stent occlusion with the right popliteal trifurcation vessels patent. Chest x-ray showed no pulmonary infiltrates.   IMPRESSION: This is [name] with [name] history of diabetes and peripheral vascular disease with ulceration who was admitted for psychosis.   RECOMMENDATIONS:  1. Her psychiatric condition is stabilized. Looking at her right lower extremity, there does not appear to be [name] great deal of cellulitic changes. She has two small ulcers with eschar present anteriorly and laterally on the lower leg. The heel has [name] larger ulcer with heaped up debris and the dorsal aspect of the second toe has eschar on it. There is no drainage from any of the wounds. There is only minimal erythema around the heel wound and the other ones are nonerythematous. She states that the culture in the ER was taken from the second toe wound. As this is [name] closed wound with eschar present, the culture results was not very helpful. She has had no fever and her white count is normal.  2. This appears to be peripheral vascular disease with chronic ulceration without any evidence of acute infection. I spoke with Dr. Wyn Quaker, who has been treating her ulcers (most recently two weeks ago.). He did not feel that they were any worse than before. He would like to perform further outpatient testing in his office to evaluate her blood flow through the stents.  3. I would not treat her with antibiotics at this time. The multiple organisms present are representative of what is on the surface of  the skin.  4. She will follow up with Vascular Surgery as an outpatient. If she has clotted stents, and no further intervention is possible, she may need below-knee amputation as the wounds would not likely heal. If prior to that she developed drainage from the wounds (wet gangrene) then I would consider IV therapy prior to below-knee amputation. This is [name] high-level Infectious Disease consult due to the risk of  loss of limb. Thank you very much for involving me in this patient's care.  ____________________________ Rosalyn Gess. Hasten Sweitzer, MD meb:jm D: 01/23/2012 16:00:24 ET T: 01/24/2012 09:57:49 ET JOB#: [id number]  cc: Rosalyn Gess. Virga Haltiwanger, MD, <Dictator> Shaquanda Graves E Jordane Hisle MD ELECTRONICALLY SIGNED 02/02/2012 16:25

## 2014-05-19 NOTE — Consult Note (Signed)
Brief Consult Note: Diagnosis: CP, neg troponin, unremakable ECG.   Patient was seen by consultant.   Consult note dictated.   Comments: REC  Agree with current therapy, Lexi-sest in am.  Electronic Signatures: Marcina MillardParaschos, Amyri Frenz (MD)  (Signed 01-Jun-14 11:28)  Authored: Brief Consult Note   Last Updated: 01-Jun-14 11:28 by Marcina MillardParaschos, Faust Thorington (MD)

## 2014-05-19 NOTE — Discharge Summary (Signed)
PATIENT NAME:  Alyssa [name], WAKEFIELD MR#:  [id number] DATE OF BIRTH:  [birth year]  DATE OF ADMISSION:  01/18/2012 DATE OF DISCHARGE:  01/24/2012  PRIMARY CARE PHYSICIAN: Dr. Burnadette Pop.    VASCULAR DOCTOR: Dr. Festus Barren.   FINAL DIAGNOSES:  Peripheral vascular disease, ulcer due to poor blood supply on the feet,  Diabetes mellitus, depression.  HISTORY OF PRESENT ILLNESS:  A 74 year old Caucasian female with a past medical history of diabetes mellitus with nephropathy and neuropathy, history of poor compliance, hypertension, hyperlipidemia, COPD with ongoing tobacco abuse, peripheral vascular disease with recent stent placement left lower extremity during the previous admission. The patient is on Plavix.  History of degenerative joint disease, coronary artery disease, brought to the Emergency Room via police because patient's son has called police as she was being weird and became psychotic. According to ER physician's history, the patient has called 911 several times stating that she has a methamphetamine lab behind her home. Police had been to her home several times and they could not find any lab behind her home. Today, her son has called the police stating that she is quite psychotic. The patient was brought to the Emergency Room with police and initially she was evaluated by ER physician for behavior problems. The patient was found to be hyperglycemic with elevated blood sugar level up to 450 and also noticed cellulitis of the right lower extremity and possible gangrene.  ER physician, Dr. Mindi Junker, has called vascular surgeon, Dr. Gilda Crease, regarding the right lower extremity foot ulcers and possible gangrene, and he  recommended to admit to hospitalist service and he will do further workup.     HOSPITAL COURSE:  Initially after the first day, the patient remained quiet. She was initially started on involuntary commitment because of her violent behavior and psychosis. She was followed by psychiatry  doctors throughout the hospital course and she became very cooperative and quiet later on and psychiatry discontinued her involuntary commitment and cleared her for  going home and regular followup as outpatient   OTHER ISSUES DURING THE HOSPITAL STAY:  1.  Gangrene of the foot, toes and hyperglycemia. As recommended by vascular surgeon, CT angiogram of the right lower extremity was done and it showed some blockages which were new when compared to the previous findings but she had those acute changes of blackening of the toes with eschar formation so they suggested continuing antibiotics. They were planning to do further management as outpatient once the patient is discharged, advised to follow and they will take over the care her.  However, from her skin superficial cultures were taken on admission and they grew multiple bacteria so she was on broad-spectrum antibiotic coverage but when we called infectious disease specialist, Dr. Leavy Cella, he had an opinion that her wounds are the same as they were in the past as spoke with Dr. Wyn Quaker, who saw the patient 1 week ago and as blood cultures are taken superficially from the skin, he said that they are just contamination from the skin as she does not have fever or does not have rasing WBC counts, and suggested to stop all antibiotics and discharge home without any antibiotic therapy so she is being discharged home without any antibiotic therapy with advice to follow up in vascular surgery clinic with Dr. Wyn Quaker for further management.  2.  For her hyperglycemia, she was started on Lantus and her blood sugar level came under control.  3.  Psychosis. Haldol was started by psychiatric and she became completely  alert and oriented later on and remained very cooperative throughout the hospital course.  4.  Hypertension initially was uncontrolled but later on came under control with medications.  5.  Peripheral vascular disease. She needs to be followed by a vascular surgeon  in clinic.    6.  Generalized weakness and unable to ambulate properly due to peripheral vascular disease. PT evaluation was done and they suggested to send her home with home health aide that we arranged on discharge.   IMPORTANT LAB RESULTS DURING THE HOSPITAL STAY: Blood sugar 470, BUN 19, creatinine 1.08 on admission. Ethanol less than 3. WBC 8.5, hemoglobin 11.9, platelet count 384, MCV 89. Salicylate less than 1.7. Thyroid stimulating hormone 1.32 on admission. Urinalysis grossly negative on admission. Urine for toxicology grossly negative. Wound culture from the skin developed Enterobacter cloacae, Staphylococcus aureus and Enterococcus faecalis. X-ray of the right foot does not show any acute bony injuries of the foot. At the time of discharge glucose 269, BUN 21, creatinine 1.0, sodium 139, potassium 3.8, chloride 104, CO2 29.   CONDITION ON DISCHARGE: Stable.   CODE STATUS: Full code.   MEDICATIONS ADVISED ON DISCHARGE: Pravastatin 40 mg oral tablet once a day, losartan 100 mg oral tablet once a day, gabapentin 300 mg capsule 3 times a day, clopidogrel 75 mg oral tablet once a day, nicotine patch transdermal once a day, pantoprazole tablet once a day, polyethylene glycol 17 grams orally once a day as needed for constipation, insulin glargine 15 units subcutaneous once a day, Percocet tablet every 6 hours for 3 days as needed for pain, Haldol 0.5 mg oral tablet every 8 hours for 10 days.   HOME HEALTH: Yes.   PHYSICAL THERAPY, NURSE, AIDE, HOME OXYGEN: No.   DIET ON DISCHARGE: Low sodium, low fat, low cholesterol, diabetic controlled ADA diet. Diet consistency regular.   ACTIVITY LIMITATION: As tolerated. Return to work after followup with M.D.   Chelsea PrimusIMEFRAME TO FOLLOWUP: In 1 to 2 weeks with Dr. Festus BarrenJason Dew in [facility] and Dr. Burnadette PopLinthavong, primary care physician.    TOTAL TIME SPENT ON DISCHARGE: 45 minutes.     ____________________________ Hope PigeonVaibhavkumar  G. Elisabeth PigeonVachhani, MD vgv:cs D: 01/24/2012 14:36:00 ET T: 01/25/2012 18:03:13 ET JOB#: [id number]  cc: Marisue IvanKanhka Linthavong, MD Annice NeedyJason S. Dew, MD Hope PigeonVaibhavkumar G. Elisabeth PigeonVachhani, MD, <Dictator>    Altamese DillingVAIBHAVKUMAR Yenty Bloch MD ELECTRONICALLY SIGNED 02/23/2012 8:20

## 2014-05-19 NOTE — H&P (Signed)
PATIENT NAME:  Alyssa [name], Alyssa [name] MR#:  [id number] DATE OF BIRTH:  1940-08-21  DATE OF ADMISSION:  06/26/2012  CHIEF COMPLAINT:  Chest pain.   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 The patient is very lethargic, and was given 0.4 mg of Narcan in the Emergency Room, with a brief improvement in her mental status. However, even when awake and in pain, she was a horrible historian. She cannot quantify her chest pain, how long it lasted, or whether it was of a pressure nature. During her prior history of coronary artery disease, she presented with nausea. She denies any nausea now. She is on chronic OxyContin and oxycodone, I assume due to claudication with rest pain, as she was in severe pain once I gave her the Narcan today. In the Emergency Room, she was noted to be lethargic as well, but able to be roused. She is being admitted for rule out acute MI with serial cardiac enzymes. She was also noted to have a very significant left Achilles heel vascular/pressure ulcer, which is in need of treatment.   The patient's primary care doctor is someone at [facility]. Her vascular surgeon is Festus Barren. She was seen by Jamse Mead during last admission for chest pain, which was just 3 months ago from February 8 to February 12, and at that time troponins were elevated and a Myoview was normal.   PAST MEDICAL HISTORY:   1.  Coronary artery disease, with a non-ST segment elevation MI in 2006, status post PTCA/stent x 2, details not known.   2.  Ischemic cardiomyopathy. She has mild  hypokinesis of all walls, with an EF of 45% and tricuspid, mitral and aortic insufficiency by echo done in February 2014.   3.  Peripheral vascular disease. 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   4.  Diabetes mellitus with neuropathy and nephropathy.   5.  COPD, with history of tobacco use, extensive.   6.  Hyperlipidemia.   CURRENT MEDICATIONS:  Per review of [facility] MAR. 1.  Aspirin 81 mg daily. 2.  Coreg 12.5 mg b.i.d.  3.  Plavix 75 mg daily. 4.  Lasix 20 mg daily. 5.  Hydrochlorothiazide 25 mg daily. 6.  Neurontin 600 mg twice daily. 7.  Imdur 30 mg daily. 8.  Several eyedrops.  9.  Lantus 17 units daily.  10.  Losartan 100 mg daily.  11.  Multivitamin daily.  12.  Nitroglycerin tablet as needed.  13.  NovoLog sliding scale insulin t.i.d. with meals. 14.  OxyContin 10 mg q. 12.  15.  Oxycodone 5 mg every 6 hours as needed, last dose 3:30 a.m. on the morning of admission. 16.  Pravastatin 40 mg daily. 17.  Requip 4 mg twice daily. 18.  Risperdal 0.5 mg twice daily.  19.  Ambien 5 mg at bedtime as needed for insomnia.   PAST SURGICAL HISTORY: 1.  Amputation of the second toe on the right foot. Amputation apparently occurred sometime in February at an outside facility.  2.  Cataract resection of the right eye April 2014.   ALLERGIES: CARDIZEM, CYMBALTA, NONSTEROIDAL ANTI-INFLAMMATORIES and PENICILLIN.   LAST HOSPITALIZATION:  03/06/2012 to 03/10/2012 for chest pain with elevated troponin.   FAMILY HISTORY:  Coronary artery disease in father.   SOCIAL HISTORY:  She is a lifelong smoker, quit in December 2013. She is currently living at [facility] still, and prior to that was living at home.   REVIEW OF SYSTEMS:  Difficult to obtain, as patient is so lethargic. She does report right and left lower extremity pain. She has had no recent weight changes. She denies fever,  fatigue. No recent changes in vision. She does have a history of cataracts. No recent ear pain, tinnitus or seasonal rhinitis. She denies sinus pain. No cough, wheezing, hemoptysis or dyspnea. Positive chest pain, per HPI. No orthopnea, no edema. She does have dyspnea on exertion. No nausea, vomiting, diarrhea or abdominal pain. No dysuria, hematuria or incontinence. No history of polyuria or nocturia, but she does have history of diabetes, which is currently uncontrolled by review of MAR. No history of anemia, easy bruising or bleeding. She does have chronic leg pain secondary to claudication. She has a history of diabetic neuropathy, and endorses numbness in her feet. She has no history of anxiety, but does have a history of insomnia.   PHYSICAL EXAMINATION: GENERAL: This is a well-nourished, elderly female who appears to be in no apparent distress, sleeping comfortably.  VITAL SIGNS: Temperature is 98.6, blood pressure 103/41, pulse 57, rhythm is regular, respirations are 16. She is satting 98% on room air.  HEENT:  Pupils are equal, round, reactive to light. Extraocular movements are intact. Sclerae are nonicteric. Oropharynx is benign.  NECK:  Supple, without lymphadenopathy, JVD, thyromegaly or carotid bruits.  LUNGS:  Clear to auscultation bilaterally, with no wheezing or rhonchi.  CARDIOVASCULAR EXAM:  Regular rate and rhythm. No murmurs, rubs or gallops. Pedal pulses are nonpalpable. She is missing a toe on the right foot.  ABDOMEN: Soft, nontender, nondistended, with good bowel sounds and no evidence of hepatosplenomegaly.  EXTREMITIES:  She is moving all extremities.  SKIN: Warm and dry. She has a 4 cm stage III Achilles heel pressure ulcer on the right, with rolled edges and exposed tendon.   LYMPH:  She has no cervical, axillary, inguinal or supraclavicular lymphadenopathy.  NEUROLOGICAL:  Cranial nerves are intact. Deep tendon reflexes are diminished at the knees. Sensation is not intact  to light touch. She is not alert or oriented to person, place and time. She is cooperative, and does answer appropriately when not falling back asleep.   ADMISSION LABORATORY DATA:  Sodium 139, potassium 4.4, chloride 108, bicarb23, BUN 79, creatinine 1.55, glucose 188. White count 7.6, hemoglobin 10.2, platelets 177, MCV is 84. CPK is 56, MB is 2.9. Troponin I is 0.02. EKG shows sinus bradycardia with Q waves in lead III and T-wave inversions in aVL. Portable chest x-ray is within normal limits.   ASSESSMENT AND PLAN: 1.  Chest pain. Unclear history of onset of chest pain, given the patient's encephalopathy due to oxycodone. However, she has a strong history of coronary artery disease and peripheral vascular disease and will, therefore, be admitted for rule out cardiac disease. This is her second admission in 4 months for chest pain, and I will obtain a Cardiology referral to consider repeating her cardiac cath. This will be problematic, however, given her increased creatinine compared to prior admissions. Hopefully this will improve with IV  fluids. If Cardiology agrees to cath her, she will benefit from acetylcysteine.   2.  Peripheral vascular disease. She has a nonhealing, chronic stage III pressure ulcer on the right Achilles. She has nonpalpable pedal pulses. Wound consult has been ordered. Her vascular surgeon is Dr. Wyn Quaker.   3.  Uncontrolled diabetes mellitus. Review of blood sugars from the facility suggest that her diabetes is uncontrolled on current regimen of 17 units of Lantus and NovoLog sliding scale. Fastings have been above 170 consistently. I will increase her Lantus to 25 units daily and continue sliding scale, as 70/30 might be problematic, given her decreased GFR.   4.  Acute on chronic renal failure. The patient's last creatinine prior to admission was 1.44 two weeks ago, and prior to that 1.17 in March. I suspect this may be due to dehydration due to decreased mental status if she has  been receiving oxycodone and OxyContin and been lethargic. Will hydrate, obtain a renal ultrasound and Nephrology consult.   5.  Anemia, new onset. The patient's hemoglobin was 12 in February. She has a normal MCV. Will check EPO level and guaiac her stools.   Estimated time of care:  60 minutes.    ____________________________ Duncan Dull, MD tt:mr D: 06/26/2012 18:01:43 ET T: 06/26/2012 18:26:55 ET JOB#: [id number]  cc: Duncan Dull, MD, <Dictator> Duncan Dull MD ELECTRONICALLY SIGNED 07/20/2012 17:21

## 2014-05-19 NOTE — Consult Note (Signed)
PATIENT NAME:  Alyssa [name], Alyssa [name] MR#:  [id number] DATE OF BIRTH:  [birth year]  CARDIOLOGY CONSULTATION REPORT  DATE OF CONSULTATION:  06/27/2012  REFERRING PHYSICIAN:  [place].  PRIMARY CARE PHYSICIAN: Bronstein  CONSULTING PHYSICIAN:  Marcina MillardAlexander Rodriques Badie, MD  CHIEF COMPLAINT: Chest pain.   HISTORY OF PRESENT ILLNESS: The patient is [name] with known coronary artery disease and profound peripheral vascular disease, referred for evaluation of chest pain. The patient reports undergoing coronary stents in 2006 at [facility]. The patient has had significant peripheral vascular disease, status post prior iliac stents by Dr. Wyn Quakerew. The patient recently underwent amputation of the right second toe by Dr. Ether GriffinsFowler and has [name] nonhealing ulcer on her heel.   The patient presented to [facility] Emergency Room on 06/26/2012 with [name] chief complaint of chest pain. In the Emergency Room the patient apparently appeared lethargic and was treated with Narcan, with increased arousal. The patient was admitted to telemetry where she was ruled out for myocardial infarction by CPK, isoenzymes, and troponin. She currently is chest pain-free. Admission labs were notable for an elevated BUN of 79 and [name] creatinine of 1.55 respectively.   PAST MEDICAL HISTORY: 1.  History of non-ST elevation myocardial infarction in 2006, status post coronary artery stents at [facility].  2.  Mild ischemic cardiomyopathy, with LVEF of 45%.  3.  Peripheral vascular disease, status post left iliac stent, amputation of the right second toe, and nonhealing ulcer on right heel.  4. Diabetes mellitus, with peripheral neuropathy and nephropathy.  5.  COPD.  6.  Hyperlipidemia.   MEDICATIONS: Aspirin 81 mg daily, carvedilol 12.5 mg b.i.d. clopidogrel 75 mg daily, furosemide 20 mg daily, Imdur 30 mg daily, losartan 100 mg daily, Neurontin 600 mg b.i.d., Lantus insulin 17 units daily, NovoLog sliding-scale t.i.d. with meals,  OxyContin 10 mg b.i.d., oxycodone 5 mg q.6 hours p.r.n., Requip 4 mg b.i.d., Risperdal 0.5 mg b.i.d., Ambien 5 mg at bedtime, p.r.n.   SOCIAL HISTORY: The patient currently is [name] resident at [facility]. She previously smoked 2 packs of cigarettes [name] day. She currently reports smoking 7 cigarettes [name] day.   FAMILY HISTORY: Known coronary artery disease.   REVIEW OF SYSTEMS:  CONSTITUTIONAL: No fever or chills.  EYES: No blurry vision.  EARS: No hearing loss.  RESPIRATORY: The patient does have shortness of breath due to underlying COPD.  CARDIOVASCULAR: The patient does have chest pain, as described above.  GASTROINTESTINAL: No nausea, vomiting, diarrhea, or constipation.  GENITOURINARY: No dysuria or hematuria.  ENDOCRINE: No polyuria or polydipsia.  MUSCULOSKELETAL: No arthralgias or myalgias.  NEUROLOGICAL: No focal muscle weakness or numbness.  PSYCHOLOGICAL: No depression or anxiety.   PHYSICAL EXAMINATION: VITAL SIGNS: Blood pressure 114/72, pulse 58, respirations 20, temperature 97.4, pulse ox 98%.  HEENT: Pupils equal and reactive to light and accommodation.  NECK: Supple, without thyromegaly.  LUNGS: Were clear.  HEART: Normal JVP. Normal PMI. Regular rate and rhythm. Normal S1, S2. No appreciable gallop, murmur, or rub.  ABDOMEN: Soft, nontender.  EXTREMITIES: The patient has diminished pulses. Amputation of the right second toe. Has [name] nonhealing ulcer on the right heel.  NEUROLOGIC: The patient is alert and oriented x 3. Motor and sensory both grossly intact.   IMPRESSION: [name] with known coronary artery disease, profound peripheral vascular disease, evidence for dehydration with elevated BUN and creatinine, with chest pain but negative troponins. Electrocardiogram is nondiagnostic.   RECOMMENDATIONS: 1. Agree with overall current therapy.  2.  Would defer full-dose anticoagulation.  3.  Agree with Lexiscan sestamibi study in the [name].m.      ____________________________ Marcina Millard, MD ap:dm D: 06/27/2012 11:26:55 ET T: 06/27/2012 12:33:59 ET JOB#: [id number]  cc: Marcina Millard, MD, <Dictator> Marcina Millard MD ELECTRONICALLY SIGNED 07/05/2012 8:48

## 2014-05-19 NOTE — Discharge Summary (Signed)
PATIENT NAME:  Alyssa [name], BALLO MR#:  [id number] DATE OF BIRTH:  16-Jan-1941  DATE OF ADMISSION:  03/06/2012 DATE OF DISCHARGE:  03/10/2012  DISCHARGE DIAGNOSES: 1.  Chest pain, elevated troponin, demand supply ischemia. 2.  History of coronary artery disease. 3.  Peripheral vascular disease. 4.  Diabetes mellitus.  5.  Diabetic neuropathy. 6.  Gangrenous toe, status post amputation.   HISTORY OF PRESENT ILLNESS: The patient is a 74 year old Caucasian female with past medical history of diabetes mellitus, nephropathy, neuropathy, poor compliance with the medications, hypertension, hyperlipidemia, COPD, peripheral vascular disease with stent placement in left lower extremity a few months ago and had gangrenous toe and at [facility] podiatry did an amputation and was on IV antibiotic, discharged to [facility] for finishing her IV antibiotic course.  She was sent to the Emergency Room because of chest tightness complaint.  Her troponin was 0.1 but there was no EKG changes on arrival so she was admitted with possibility of non-ST elevation MI.    HOSPITAL COURSE AND STAY: For chest pain and troponin, her troponin remained in the same range all 3 times followed. Cardiology consult was done. Initially she was started on Lovenox therapeutic dose but echocardiogram was also stable so cardiology suggested no further work-up and labeled it as demand versus supply ischemia and advised to continue her baseline cardiac medication due to history of coronary artery disease. She remained stable and no further complaints throughout the hospital course.   Other medical issues addressed during the hospital stay:   1.  Diabetes mellitus: We followed glucose and insulin sliding scale and controlled on Lantus.  2.  Hypertension. We continued losartan, carvedilol and hydrochlorothiazide and remained under control.   3.  Peripheral vascular disease, status post amputation on the toe and status  post stent placement in the limbs.  She had recently had an amputation of the toe and for osteomyelitis, she was on IV antibiotics and advised to be continued up to the 9th of March as per [facility] podiatry and vascular doctors.  We got all the records from [facility] like discharge summary and history and physical from [facility] and we continued the same antibiotic at same doses over here, and we are discharging to continue the same. She is advised to follow with [facility] vascular and podiatry doctors as they have all the records and follow-up set up with the patient.  4.  Chronic pain due to peripheral vascular disease and amputation. She was on OxyContin. We continued the same and pain remained under control.  5.  Diabetic neuropathy. We continued gabapentin and it remained under control.  6.  Chronic obstructive pulmonary disease. She was stable, not any acute distress.  7.  History of smoking. She stopped smoking almost a month and she was on a nicotine patch.  We continued nicotine patch while she was in the hospital.  8.  Vaginal fungal infection. She was on miconazole cream and we continued the same.  CONSULTS DURING THE HOSPITALIZATION:  Dr. Darrold Junker, cardiology consult.   IMPORTANT LABORATORY RESULTS DURING THE HOSPITAL COURSE:  WBC 6.6, hemoglobin 10.2, platelet count 221 on admission.  Glucose 291 on admission, BUN 24, creatinine 0.96, sodium 140, potassium 3.9, CO2 26, chloride 105, calcium 8.5. Troponin 0.1 on admission. Lipase 54 on admission.  Troponin went to 1.08 and remained at 0.9 on further follow-up. Echocardiogram:  Mild hypokinesis of almost all the ways, mildly reduced left ventricular function, estimated ejection fraction 45%, left  ventricular hypertrophy, mild to moderate tricuspid regurgitation and mild to moderate mitral regurgitation and mild aortic insufficiency.   CONDITION ON DISCHARGE: Stable.   CODE STATUS: Full code.   MEDICATIONS ADVISED ON DISCHARGE:  Oxycodone 5 mg every 6  hours, OxyContin 10 mg extended-release every 12 hours, pravastatin 40 mg once a day, ropinirole 4 mg 2 times a day, insulin glargine 15 units subcutaneous once a day, vancomycin 1500 mg every 24 hours until the 9th of March, metronidazole 500 mg oral tablet every 8 hours day until the 9th of March, losartan 100 mg oral tablet once a day, isosorbide mononitrate 30 mg oral extended release once a day, gabapentin 300 mg oral capsule 2 capsules 2 times a day, aspirin 325 mg oral delayed-release tablet once a day, clopidogrel 75 mg oral tablet once a day, carvedilol 12.5 mg oral tablet 2 times a day, hydrochlorothiazide 50 mg oral tablet once a day,  ceftazidime 1 gram every 8 hours IV for 28 days until 04/04/2012, zolpidem 5 mg oral tablet once a day at bedtime as needed for insomnia, docusate sodium 100 mg oral capsule 2 times a day as needed for constipation, pantoprazole 40 mg oral delayed-release once a day, nicotine 40 mg patch transdermal once a day.  DRESSING CARE:  Dry bandage has been applied to the wound.  Keep the bandage clean and dry. Keep dressing dry.   HOME OXYGEN:  No.  DIET: Low sodium, low fat, low cholesterol, carbohydrate-controlled ADA diet, consistency regular.   ACTIVITY LIMITATION:  As tolerated.   TIMEFRAME TO FOLLOW UP:  Within 1 to 2 weeks.  Is to follow CBC, BMP, and vancomycin trough level every week and fax it to Dr. Felecia Janhrisklipstoin at [phone].  She is advised to [facility], Dr. London SheerKashefsky Howard in a week ago. She is to follow [facility], Dr. Merideth AbbeyVallabhanoni Raghuveer in a week.  The number is [phone].  She was advised to follow with Dr. Felecia Janhrisklipstoin, family care physician at [facility] in a week.  Number is [phone].  Total time spent on this discharge: 45 minutes.     ____________________________ Hope PigeonVaibhavkumar G. Elisabeth PigeonVachhani, MD vgv:ct D: 03/10/2012 08:05:19 ET T: 03/10/2012 08:45:41 ET JOB#: [id number]  cc: Hope PigeonVaibhavkumar G. Elisabeth PigeonVachhani, MD,  <Dictator> Altamese DillingVAIBHAVKUMAR Minard Millirons MD ELECTRONICALLY SIGNED 03/19/2012 18:19

## 2014-05-19 NOTE — Consult Note (Signed)
Brief Consult Note: Diagnosis: CP, SOB, borderline elevated trop, likely demand/supply, s/p recent amputation right second toe, with gangrene, non-healing ulcer probably requiring amputation, with acute on chronic bronchitis.   Patient was seen by consultant.   Consult note dictated.   Comments: REC  Agree with current therapy, defer full dose anticogulation, review echo, defer cardiac cath at this time, add long-acting nitrates.  Electronic Signatures: Marcina MillardParaschos, Roni Scow (MD)  (Signed 09-Feb-14 10:32)  Authored: Brief Consult Note   Last Updated: 09-Feb-14 10:32 by Marcina MillardParaschos, [place]ore (MD)

## 2014-05-19 NOTE — Discharge Summary (Signed)
PATIENT NAME:  Alyssa [name], Alyssa [name] MR#:  [id number] DATE OF BIRTH:  [birth year]  DATE OF ADMISSION:  09/06/2012 DATE OF DISCHARGE:  09/09/2012   ADMITTING DIAGNOSIS: Dyspnea.   DISCHARGE DIAGNOSES: 1.  Dyspnea of unclear etiology at this time, questionable opiate withdrawal related. 2.  Elevated troponin. 3.  Malignant hypertension. 4.  Chronic pain syndrome. 5.  Altered mental status due to opiates. 6.  Urinary tract infection, Klebsiella. 7.  Diabetes mellitus, insulin-dependent. 8.  Hyperlipidemia. 9.  Generalized weakness. 10.  History of cerebrovascular accident, rheumatoid arthritis, coronary artery disease, peripheral vascular disease with left lower extremity ulceration, chronic obstructive pulmonary disease, chronic kidney disease, anemia, diabetic neuropathy as well as gastroesophageal reflux disease.   DISCHARGE CONDITION: Stable.   DISCHARGE MEDICATIONS: The patient is to resume: 1.  Aspirin 325 mg p.o. daily. 2.  Clopidogrel 75 mg p.o. daily. 3.  Isosorbide mononitrate 30 mg p.o. daily. 4.  Losartan 100 mg p.o. daily. 5.  Multivitamins once daily. 6.  Nitroglycerin 0.4 mg sublingually every 5 minutes as needed. 7.  Insulin sliding scale. 8.  Pantoprazole 40 mg p.o. daily. 9.  Pravastatin 40 mg p.o. at bedtime. 10.  Ropinirole 4 mg p.o. twice daily. 11.  Vitamin C 500 mg p.o. twice daily. 12.  Zolpidem 5 mg p.o. at bedtime as needed. 13.  Alprazolam 0.25 mg every 8 hours as needed. 14.  Albuterol 2.5 in 3 mL inhalation solution via nebulizer every 3 hours as needed. 15.  Carvedilol 12.5 mg p.o. twice daily. 16.  Lasix 20 mg p.o. daily. 17.  Aldactone 25 mg p.o. twice daily. 18.  Zantac 75 mg p.o. twice daily. 19.  MiraLax 17 grams orally daily as needed. 20.  Tylenol 325 mg p.o. every 6 hours as needed. 21.  Insulin aspart 4 units subcutaneously 3 times daily before meals. 22.  Acetaminophen/hydrocodone 325/5 mg 1 tablet every 4 to 6 hours as needed. 23.   Clonidine patch transdermal weekly. 24.  Gabapentin 300 mg p.o. 2 capsules 3 times daily. This is advanced dose. 25.  Amitriptyline 25 mg p.o. at bedtime. This is new medication. 26.  Insulin Lantus 7 units subcutaneously at bedtime to be advanced to regular dose of 17 units subcutaneously at bedtime when the patient's p.o. intake improves. 27.  Ciprofloxacin 500 mg p.o. twice daily for 5 more days. 28.  Nicotine transdermal patch 7 mg once daily.   HOME OXYGEN: None.   DISCHARGE DIET: 2 grams salt, low fat, low cholesterol, carbohydrate-controlled. Diet consistency regular.   ACTIVITY LIMITATIONS: As tolerated.   DISCHARGE REFERRALS: To physical therapy 2 to 7 times [name] week.   DISCHARGE FOLLOWUP: Appointment with Dr. Terance HartBronstein in 2 days after discharge.    CONSULTANTS: Palliative care, care management, social work, Dr. Gwen PoundsKowalski.   RADIOLOGIC STUDIES: Chest, portable single view, 09/06/2012, showed no evidence of aspiration or acute cardiopulmonary abnormality. CT scan of head without contrast, 09/06/2012, revealed no acute intracranial process. Echocardiogram, according to cardiologist, Dr. Gwen PoundsKowalski: Normal ventricular systolic function with ejection fraction of 55% to 60%,   HISTORY AND HOSPITAL COURSE: The patient is [name] 74 year old Caucasian female with history of chronic pain syndrome who presented to the hospital with complaints of dyspnea. Apparently she was recently taken off MS Contin and presented to the hospital with complaints of facial numbness. Please refer to Dr. Mathews RobinsonsWieting's admission on 09/06/2012. She was sent back when she was found to have [name] urinary tract infection. However, was called back to the hospital whenever the  patient's troponin was noted to be elevated at 0.06. She was also complaining of shortness of breath. Hospitalist services were contacted for admission.   On arrival to the hospital, the patient's temperature was 100, pulse was 60, respiration rate was 18, blood  pressure 147/68 and saturation was 96% on room air. Physical examination was unremarkable. The patient's EKG showed normal sinus rhythm at 64 beats per minute and poor R wave progression; otherwise, no acute ST-T changes were noted. The patient's radiologic studies were unremarkable. The patient's lab data done on 09/05/2012 showed mild elevation of BUN to 29 and glucose of 217, otherwise, BMP was unremarkable. The patient's lipase level was low at 48. Liver enzymes revealed albumin level of 2.7 and alkaline phosphatase 201, otherwise unremarkable. Cardiac enzymes first set as well as subsequent set of cardiac enzymes revealed mild elevation of troponin to 0.06. Otherwise, MB fraction as well as CK total were within normal limits. However, on the third set, the patient's troponin was also normal at 0.04 and fourth set showed troponin 0.05. The patient's TSH was normal at 1.12. White blood cell count was normal at 10.6, hemoglobin was 9.9 and platelet count was 167. Urinalysis was remarkable for yellow cloudy urine, negative for glucose, bilirubin or ketones. Specific gravity was 1.015, pH was 5.0, 2+ blood, more than 500 mg/dL protein, positive for nitrites, 3+ leukocytes esterase, 54 red blood cells, 405 white blood cells, 3+ bacteria, 1 epithelial cell and mucus as well as white blood cell clumps were present. Urine cultures revealed Klebsiella pneumoniae species resistant to ampicillin, sensitive to all antibiotics but intermediately resistant to nitrofurantoin. The patient was initiated on ciprofloxacin. With this the patient's condition somewhat improved. She was also treated for her shortness of breath and was evaluated for elevated troponin.  In regards to dyspnea, it was of unclear etiology. The patient's oxygenation remained always normal. She had normal oxygenation while she was resting as well as on exertion. It was unclear and was postulated that due to her discomfort and possibly opiate withdrawal she  had [name] reaction resulting in some dyspnea.  In regards to elevated troponin, the patient was evaluated by cardiologist who did not feel that the patient needs to have any other investigation done. He felt that the patient may follow up with him in the next week or so after discharge.   In regards to blood pressure issues, the patient was noted to have markedly elevated blood pressure while she was in the hospital very likely due to pain syndrome as well as her anxiety. Her blood pressure medications were advanced with which the patient's blood pressure improved. On the day of discharge, 09/09/2012, the patient's blood pressure is ranging from 129 systolic to 131 and 70s to 80s diastolic. It is recommended for follow the patient's blood pressure readings as outpatient and make decisions about advancement of her blood pressure medications even higher.   The patient was complaining of severe pain. She was given pain medications, however, she developed significantly altered mental status and in her medications were stopped. At that point, we discussed with palliative care and slowly re-initiated her pain medications as well as started also on advanced doses of Gabapentin and amitriptyline. It is recommended to follow the patient's overall status and make decisions about possibly adding Cymbalta or changing gabapentin to Lyrica as Lyrica has faster onset of action and could improve her wellbeing overall.  At this point, she is to continue pain medications at lower doses.  For urinary  tract infection, Klebsiella, as mentioned above, the patient is to continue antibiotic therapy for 5 more days to complete 7 day course.   For diabetes mellitus, the patient's oral intake was poor due to intermittent altered mental status while she was in the hospital. Her Lantus was decreased to 7 units subcutaneously at bedtime. She is to continue sliding scale insulin as well as short-acting insulin for her meals.  In regards  to chronic medical problems such as stroke, rheumatoid arthritis, diabetes, COPD, CKD, anemia and gastroesophageal reflux disease, as well as neuropathy, the patient is to continue her outpatient management.   Her vital signs on the day of discharge: Temperature 97.9, pulse was 64, respiratory rate was 17 to 18, blood pressure 131/70 and saturation was 98% on room air at rest.   TIME SPENT: 40 minutes.  ____________________________ Katharina Caper, MD rv:sb D: 09/09/2012 15:07:52 ET T: 09/09/2012 15:49:32 ET JOB#: [id number]  cc: Katharina Caper, MD, <Dictator> Teena Irani. Terance Hart, MD Katharina Caper MD ELECTRONICALLY SIGNED 09/15/2012 12:23

## 2014-05-19 NOTE — Consult Note (Signed)
PATIENT NAME:  Alyssa [name], PORTEE MR#:  [id number] DATE OF BIRTH:  February 05, 1940  DATE OF CONSULTATION:  03/06/2012  REFERRING PHYSICIAN:   CONSULTING PHYSICIAN:  Marcina Millard, MD  PRIMARY CARE PHYSICIAN: [facility].  VASCULAR SURGEON: Dr. Festus Barren.  CHIEF COMPLAINT: Shortness of breath.   REASON FOR CONSULTATION: Consultation requested for evaluation of shortness of breath, chest discomfort and elevated troponin.   HISTORY OF PRESENT ILLNESS: The patient is a 74 year old female with known history of coronary artery disease, severe peripheral vascular disease, who presents with a recent history of shortness of breath with chest tightness. The patient apparently underwent recent amputation of the second right toe with a nonhealing ulcer. The patient was discharged to [facility] where she was to receive intravenous antibiotics. The patient reports that at her new facility, the temperature was very hot. She became uncomfortable and experienced shortness of breath with associated chest tightness. She was brought to [facility] Emergency Room where EKG was nondiagnostic. Admission labs were notable for a peak troponin of 1.08. Other notable labs included a hemoglobin and hematocrit of 10.2 and 30.3 respectively. The patient's chief complaint currently is upper airway congestion.   PAST MEDICAL HISTORY:  1.  Status post non-ST-elevation myocardial infarction during a complicated hospitalization in 2006, at which time she received 2 coronary stents. 2.  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  3.  Hypertension.  4.  Hyperlipidemia.  5.  COPD. 6.  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   MEDICATIONS ON ADMISSION: Aspirin 81 mg daily, carvedilol 6.25 mg b.i.d., losartan 100 mg daily, pravastatin 40 mg at bedtime, vancomycin 1.5 grams IV daily, ceftazidime 1  gram IV q. 8 hours, Flagyl 500 mg q. 8 hours, miconazole 2% cream b.i.d., pravastatin 40 mg at bedtime,  OxyContin 10 mg q. 12 hours and oxycodone q. 6 hours p.r.n.   SOCIAL HISTORY: The patient is married and typically resides with her husband, but currently was at [employer]. She quit tobacco abuse in 12/2011.   FAMILY HISTORY: Positive for coronary artery disease and cerebrovascular disease.   REVIEW OF SYSTEMS:   CONSTITUTIONAL: No fever or chills.  EYES: No blurry vision.  EARS: No hearing loss.  RESPIRATORY: Shortness of breath as described above.  CARDIOVASCULAR: Chest tightness as described above.  GASTROINTESTINAL: No nausea, vomiting, diarrhea or constipation.  GENITOURINARY: No dysuria or hematuria.  ENDOCRINE: No polyuria or polydipsia.  INTEGUMENTARY: No rash.  MUSCULOSKELETAL: No arthralgias or myalgias.  NEUROLOGICAL: No focal muscle weakness or numbness.  PSYCHOLOGICAL: No depression or anxiety.   PHYSICAL EXAMINATION:  VITAL SIGNS: Blood pressure 146/70, pulse 61, respirations 18, temperature 98.1, pulse oximetry 97%.  HEENT: Pupils equal, reactive to light and accommodation.  NECK: Supple without thyromegaly.  LUNGS: Decreased breath sounds in both bases.  HEART: Normal JVP. Normal PMI. Regular rate and rhythm. Normal S1, S2. No appreciable gallop, murmur, or rub.  ABDOMEN: Soft and nontender.  EXTREMITIES: There was diminished pulses on both sides. Her right foot has a surgical dressing on it with recent amputation of the second toe.  NEUROLOGIC: The patient is alert and oriented x 3. Motor and sensory are both grossly intact.   IMPRESSION: This is a 74 year old female with multiple comorbidities with severe peripheral vascular disease, status post recent amputation of the right second toe with a nonhealing ulcer with gangrene awaiting probable amputation. She is  currently receiving intravenous antibiotics. Symptoms are more consistent with acute on chronic  bronchitis with borderline elevated troponin in the absence of ECG changes. The patient is a poor candidate for cardiac catheterization at this time and currently appears to be chest pain free.   RECOMMENDATIONS:  1.  Agree with overall current therapy.  2.  Would defer full dose anticoagulation at this time.  3.  Review 2D echocardiogram.  4.  Would defer noninvasive or invasive cardiac evaluation at this time. 5.  Will add long-acting nitrates to current regimen.     ____________________________ Marcina MillardAlexander Drayton Tieu, MD ap:aw D: 03/07/2012 10:26:53 ET T: 03/07/2012 11:00:03 ET JOB#: [id number]  cc: Marcina MillardAlexander Austen Wygant, MD, <Dictator> Marcina MillardALEXANDER Alima Naser MD ELECTRONICALLY SIGNED 03/23/2012 14:59

## 2014-05-19 NOTE — Discharge Summary (Signed)
PATIENT NAME:  Alyssa [name], Alyssa [name] MR#:  [id number] DATE OF BIRTH:  1940-03-06  DATE OF ADMISSION:  01/07/2012 DATE OF DISCHARGE:  01/15/2012  DISCHARGE DIAGNOSES:  1. Chronic right leg foot pain secondary to peripheral arterial disease status post vascular intervention.  2. 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  3. Insulin-dependent diabetes.  4. Hypertension.   DISCHARGE MEDICATIONS:  1. Pravastatin 40 mg p.o. at bedtime.  2. NovoLog sliding scale.  3. Losartan 100 mg p.o. daily.  4. Gabapentin 300 mg p.o. t.i.d.  5. Insulin glargine [name].k.[name]. Lantus 24 units subcutaneous at bedtime.  6. Acetaminophen 500 mg 2 tabs p.o. q.8 hours scheduled.  7. Tramadol 50 mg p.o. q.6 hours as needed for pain.  8. Insulin regular _____ units t.i.d. with meals.  9. Clopidogrel 75 mg p.o. daily.  10. Hydrochlorothiazide 12.5 mg p.o. daily.  11. MiraLax 17 grams p.o. daily as needed for constipation. 12. Pantoprazole 40 mg p.o. daily.  13. Nicotine patch 14 mg transdermal daily.   CONSULTS:  1. Vascular Surgery per Dr. Wyn Quakerew. 2. Podiatry per Dr. Alberteen Spindleline. 3. Psychiatry per Dr. Toni Amendlapacs.   PROCEDURES: The patient underwent [name] vascular procedure on 01/12/2012, stent placement.   PERTINENT LABORATORY DATA: On day of discharge, sodium 135, potassium 3.4, BUN 14, creatinine 0.83, glucose of 175. White blood cell count 9.6, hemoglobin 11.2, and platelets of 237. A1c of 12%. LDL of 62.   BRIEF HOSPITAL COURSE: 1. Chronic right leg foot pain. The patient initially came in with chronic foot and leg pain secondary to underlying peripheral arterial disease with very poor perfusion. As [name] result, she has had chronic ulcers, lesions and infections. She was started on IV antibiotics, vanc and Cipro. Vascular Surgery was consulted. The patient underwent an angiogram that showed blockage; therefore, stents were placed per Dr. Wyn Quakerew. The patient was placed on Plavix for further care, continue on cholesterol medication and also  blood pressure medication. Will need to see Dr. Alberteen Spindleline in the next few weeks as she may require amputation. Because of this foot wound, she is unable to ambulate successfully on her own; therefore, she needs further rehab. She is [name] for physical therapy, nursing care and also wound care. She completed her course of antibiotics here in the hospital. Will discontinue that upon discharge.  2. Hypertension. The patient was started on losartan 100 mg and also hydrochlorothiazide 12.5 mg and she remains stable.  3. Insulin-dependent diabetes. The patient has been noncompliant in the past with an A1c of 12%. Plan is to keep her on insulin therapy. We have titrated up on the Lantus to 24 units, will stay at that and also mealtime coverage of _____ units with meals of the regular NovoLog. She can have [name] sliding scale as well which they can contact the office for further information if needed.   DISPOSITION: She is in stable condition and being discharged to [facility] for further rehab. Follow up with Dr. Wyn Quakerew in 3 to 4 weeks. Follow up with Dr. Alberteen Spindleline in four weeks. Follow up with Dr. Burnadette PopLinthavong to establish care as scheduled.     ____________________________ Marisue IvanKanhka Deondra Labrador, MD kl:es D: 01/15/2012 07:39:17 ET T: 01/15/2012 09:41:04 ET JOB#: [id number]  cc: Marisue IvanKanhka Jauan Wohl, MD, <Dictator> Annice NeedyJason S. Dew, MD Linus Galasodd Cline, DPM Marisue IvanKANHKA Deanette Tullius MD ELECTRONICALLY SIGNED 02/03/2012 17:13

## 2014-05-19 NOTE — Consult Note (Signed)
PATIENT NAME:  Alyssa [name], Alyssa [name] MR#:  [id number] DATE OF BIRTH:  1940-07-28  DATE OF CONSULTATION:  01/19/2012  REFERRING PHYSICIAN:  Harrel Lemon, MD  CONSULTING PHYSICIAN:  Jolanta B. Pucilowska, MD  REASON FOR CONSULTATION: To evaluate a patient with altered mental status.   IDENTIFYING DATA:  The patient is a 74 year old female with no past psychiatric history.   CHIEF COMPLAINT: "I really worry about my leg."   HISTORY OF PRESENT ILLNESS: The patient was brought to the Emergency Room on petition by her son. Reportedly following minor surgery on her foot, the patient was placed in a skilled nursing facility. She complained that she was not given any pain medication there and left the facility. Upon return to home, her family noticed some change in her behavior.  The son, who was increasingly worried about her physical and mental health, brought her to the Emergency Room. She was admitted to the medical floor for a possible gangrene of right foot. I was asked  to consult on the patient. I met with her this morning. She was pleasant, polite, cooperative with excellent report, good historian, sensible.  I was called 20 minutes later that the patient is agitated, wanting to leave, calling [facility] to arrange for a transfer and 911 to talk to the Ambulance and Police. Reportedly, she had similar periods of agitation last night. She is still on involuntary commitment, however, no sitter is at her bedside. The patient most likely has delirium due to medical condition. When talking to me, the patient did not have any complaints about depression, anxiety. She denied psychotic symptoms. She denied symptoms suggestive of bipolar mania, alcohol, illicit drugs or prescription pill abuse.   PAST PSYCHIATRIC HISTORY: She reports that she was hospitalized once at [facility] many years ago at the time when she had marital problems. She was discharged on no medications. She then participated in psychotherapy for several  years. She denies any other hospitalizations, suicide attempts or substance abuse.    FAMILY PSYCHIATRIC HISTORY: None reported.   PAST MEDICAL HISTORY:  1. Peripheral vascular disease with recent stenting of the left lower extremity. Changes in her toes are on the right foot, though.  2. Diabetes mellitus with neuropathy and nephropathy.  3. Hypertension.  4. Dyslipidemia.  5. Chronic obstructive pulmonary disease.  6. History of degenerative joint disease. 7. Coronary artery disease.   ALLERGIES: CARDIZEM, PENICILLIN, CYMBALTA, ANTI-INFLAMMATORIES.   MEDICATIONS ON ADMISSION: Demerol 50 mg every 6 hours, Prevacid 30 mg at bedtime, MiraLax 17 grams daily, NovoLog 3 times daily, Lantus 10 units at bedtime, hydrochlorothiazide 12.5 mg daily, Neurontin 300 mg 3 times daily, Plavix 75 mg daily, Tylenol 500 mg every 8 hours.   MEDICATIONS AT THE TIME OF CONSULTATION: Norco 05/325, 1 to 2 tablets every 4 hours as needed. Lovenox, Lantus 10 units at bedtime, sliding scale NovoLog, Cozaar 100 mg daily, Zofran as needed, Protonix 40 mg daily, Plavix 75 mg daily, Neurontin 300 mg 3 times daily, meropenem 1 gram q. 12 hours, Pravachol 40 mg at bedtime, Requip 4 mg at 9 and 2100, vancomycin 750 mg q.24h.   SOCIAL HISTORY: She lives with her husband of 10 years. The husband has been getting demented lately, but he still drives the patient to her appointments at [facility] where his major provider is. She wants to continue this relationship as she used to work at [employer]. Her son lives on the same property in a separate house and is available to help. The  patient feels that she needs to get better to be able to take care of her husband. She feels that placement of the husband in a nursing facility is not an option. She tells me that she went to a nursing school but did not graduate, did not get her degree due to the fact that she had to take care of her mother with Alzheimer's. 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   REVIEW OF SYSTEMS: CONSTITUTIONAL: No fevers or chills. Positive for fatigue.  Reports no weight changes.  EYES: No double or blurred vision.  ENT: No hearing loss.  RESPIRATORY: No shortness of breath or cough.  CARDIOVASCULAR: No chest pain or orthopnea.  GASTROINTESTINAL: No abdominal pain, nausea, vomiting, or diarrhea. Positive for constipation.  GENITOURINARY: No incontinence or frequency.  ENDOCRINE: No heat or cold intolerance.   LYMPHATIC: No anemia or easy bruising.  INTEGUMENTARY: Positive for swelling of several toes of her right foot.  MUSCULOSKELETAL: Positive for joint pain.  NEUROLOGIC: No tingling or weakness.  PSYCHIATRIC: See history of present illness for details.   PHYSICAL EXAMINATION: VITAL SIGNS: Blood pressure 154/72, pulse 80, respirations 17, temperature 98.5.  GENERAL: This is a well-developed female in no acute distress. The rest of the physical examination is deferred to her primary attending.   LABORATORY, DIAGNOSTIC AND RADIOLOGICAL DATA:  Chemistries: Blood glucose 545, BUN 17, creatinine 1.09, sodium 134, potassium 3.5. LFTs within normal limits except for alkaline phosphatase of 281.0. Urine tox screen negative for substances. CBC: White blood count 9.0, hemoglobin 11.1, hematocrit 32.4, platelets 351.0. Urinalysis is not suggestive of urinary tract infection. Serum acetaminophen and salicylates are low.   EKG: Normal sinus rhythm, normal EKG.   MENTAL STATUS EXAMINATION:  At the time of my assessment, the patient is alert and oriented to person, place, time, and situation. She is pleasant, polite, and cooperative. She is a good historian, very engaging and funny. She is in bed wearing a hospital gown. She maintains good eye contact. Her speech is of normal rhythm, rate, and volume. Mood is worried with full affect. Thought processing is logical and goal oriented. Thought content: She denies suicidal or  homicidal ideation, delusions or paranoia. She does not appear to attend to internal stimuli. Her cognition is grossly intact. She registers 3 out of 3 and recalls 3 out of 3 objects after 5 minutes. She can spell world forward and backward. She knows three past presidents. Her insight and judgment are good.   SUICIDE RISK ASSESSMENT: This is a patient with remote history of depressive episode, possibly some cognitive decline, mild, who was brought to the hospital for altered mental status in the context of the foot gangrene.   DIAGNOSES: AXIS I:  1. Cognitive disorder, not otherwise specified.  2. Delirium secondary to medical condition.   AXIS II: Deferred.   AXIS III:  1. Peripheral vascular disease.  2. Diabetes mellitus.  3. Neuropathy. 4. Hypertension.  5. Dyslipidemia.  6. Chronic obstructive pulmonary degenerative. 7. Degenerative joint disease.  8. Coronary accident disease.   AXIS IV: Physical illness, cognitive decline, hardship of caretaker.   AXIS V: Global Assessment of Functioning score is 35.   PLAN:  1. At the time of my assessment, the patient was perfectly all right; 20 minutes later I was called that she is hallucinating and is agitating, calling 911, demanding to be transferred to [facility] for further treatment. We will order low-dose Haldol 0.5 mg  4 times daily for delirium with 2 mg of Haldol available every 4 hours for agitation. Delirium is a medical emergency. Please treat vigorously any underlying medical condition.  2. I will follow up.  3. The patient is on IVC. She needs a Actuary.  I will place an order for it.   ____________________________ Wardell Honour. Bary Leriche, MD jbp:cb D: 01/19/2012 15:22:02 ET T: 01/19/2012 16:05:48 ET JOB#: [id number]  cc: Jolanta B. Bary Leriche, MD, <Dictator> Clovis Fredrickson MD ELECTRONICALLY SIGNED 01/29/2012 23:53

## 2014-05-19 NOTE — Discharge Summary (Signed)
PATIENT NAME:  Alyssa [name], Alyssa [name] MR#:  [id number] DATE OF BIRTH:  Apr 08, 1940  DATE OF ADMISSION:  06/26/2012 DATE OF DISCHARGE:  06/27/2012  ADMITTING DIAGNOSIS: Chest pain.   DISCHARGE DIAGNOSES:  1.  Chest pain with negative cardiac enzymes, unclear etiology at this time. The patient refused further workup.  2.  Known history of coronary artery disease, status post stents and myocardial infarction in the past.  3.  Acute renal failure. 4.  Dehydration, resolved on intravenous fluids. 5.  Bradycardia suspected to due Coreg, dose decreased. 6.  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 7.  Diabetes mellitus with diabetic nephropathy and neuropathy.  8.  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  9.  Chronic obstructive pulmonary disease.  10. Ongoing tobacco abuse. 11. Hyperlipidemia.  12. Lower extremity ulcerations.   DISCHARGE CONDITION: Stable.   DISCHARGE MEDICATIONS: The patient is to resume her outpatient medications, which are: 1.  Aspirin 325 mg p.o. daily.  2.  Clopidogrel 75 mg p.o. daily. 3.  Difluprednate ophthalmic solution 0.05%, 1 drop to right eye twice daily. 4.  Gabapentin 600 mg p.o. twice daily. 5.  Isosorbide mononitrate 30 mg p.o. daily. 6.  Losartan 10 mg p.o. daily. 7.  Multivitamins 1 daily. 8.  Nitroglycerin 0.4 mg sublingually every 5 minutes as needed. 9.  Insulin aspart 80 units subcutaneously 3 times daily with meals. 10. OxyContin extended release 10 mg p.o. twice daily. 11. Pantoprazole 40 mg p.o. daily.  12. Pravastatin 40 mg p.o. at bedtime.  13. Ropinirole 4 mg p.o. twice daily.  14. Risperidone 0.5 mg p.o. twice daily.  15. Sugar-free Pro-Stat 30 mL twice daily.  16. Vitamin C 500 mg p.o. twice daily.  17. Zolpidem 5 mg p.o. at bedtime as needed.  18. Alprazolam 0.25 mg p.o. every 8 hours as needed.  19. Albuterol inhalation one vial 3 mL every 3 hours as needed.  20. Oxycodone  5 mg every 6 hours as needed.  21. Sliding scale insulin.  22. Insulin Lantus 25 units subcutaneously daily, this is a new dose.  23. Carvedilol 3.125 mg p.o. twice daily, this is a new dose.   HOME OXYGEN: None.   DIET: 2 grams salt, low fat, low cholesterol, carbohydrate-controlled diet, regular consistency.   ACTIVITY LIMITATIONS: As tolerated.    FOLLOWUP APPOINTMENT: With primary care physician in 2 days after discharge.   CONSULTANTS: Dr. Darrold Junker.   RADIOLOGIC STUDIES: Chest x-ray PA and lateral 06/26/2012 showed no acute cardiopulmonary disease. Ultrasound of kidneys 06/27/2012 showed normal appearing renal sonogram.   HISTORY OF PRESENT ILLNESS: The patient is a 74 year old Caucasian female with history of COPD, tobacco abuse, history of coronary artery disease, status post PTCA and stents, who presented to the hospital with complaints of chest pain. Please refer to this admission on 06/26/2012. On arrival to the hospital, the patient's temperature was 98.6, pulse was 57, respiration rate was 16, blood pressure 103/41 and saturation was 98% on room air. Physical exam was unremarkable. The patient did have stage III Achilles heel pressure ulcer on the right with (Dictation Anomaly) rolled  edges as well as exposed tendon, which is approximately 4 cm in diameter. The patient's lab data done on 06/26/2012, revealed mild elevation of BUN and creatinine of 79 and 1.55, glucose 188, otherwise BMP was unremarkable. The patient's liver enzymes revealed albumin level of 2.8 and alkaline phosphatase 228. Cardiac enzymes, first set as well as subsequent 2 more sets  were within normal limits. White blood cell count was normal at 7.6, hemoglobin was 10.2, platelet count 177. Coagulation panel was unremarkable.   HOSPITAL COURSE:  1.  The patient was admitted to the hospital to telemetry. Her cardiac enzymes were cycled and consultation with Dr. Darrold JunkerParaschos was obtained. Dr. Darrold JunkerParaschos felt that the  patient had chest pain, negative troponin, as well as unremarkable EKG. He recommended to continue current therapy and get a Lexiscan stress test in the morning. That was discussed with the patient; however, the patient became agitated and refused any further workup in the hospital. This was relayed to Dr. Darrold JunkerParaschos, who felt that since the patient does not have any chest pains here in the hospital, she may not need evaluation here in the hospital; however, he recommended to get a stress test or other cardiac evaluation as an outpatient in the patient's primary physician's office if possible. 2.  In regards to acute renal failure, the patient was given IV fluids and her kidney function normalized. As mentioned above, the patient's BUN and creatinine were 79 and 1.55 on day of admission, 06/26/2012; however, that all improved to 69 and 1.14 by 06/27/2012. It was felt that the patient does not need to use any diuretics at this time and diuretics were discontinued.  3.  The patient was noted to have significantly elevated blood pressure while in the hospital. Systolic blood pressure was reaching 170s on the day of admission 06/26/2012. The patient's blood pressure medications were continued and her blood pressure improved whenever she was less agitated. However, the patient was noted to be bradycardic on a dose of Coreg, so her Coreg was decreased. It is recommended to follow the patient's blood pressure readings and advance her blood pressure medications if possible.  4.  In regards to peripheral vascular disease. The patient is to continue Plavix for her diabetic pressure ulcers.  5.  For diabetes, we checked the patient's hemoglobin A1c, which was found to be elevated at 8.3 and we advanced the patient's Lantus dose to 25 units at bedtime. She is to continue sliding scale insulin. It is recommended to follow the patient's blood glucose readings and make decisions about advancement of her insulin Lantus, even  high if needed.  6.  In regards to COPD/tobacco abuse, we offered the patient tobacco replacement therapy; however, she refused. She was very agitated and wanted to return back to a facility.   She is being discharged in stable condition with above-mentioned medications and followup. On day of discharge, 06/27/2012, the patient's temperature 97.4, pulse ranging from 50s to 70s, respiration rate was 20, blood pressure 114/72, saturation was 98% on room air at rest.   TIME SPENT: 40 minutes on the patient.  ____________________________ Katharina Caperima Kandie Keiper, MD rv:aw D: 06/27/2012 16:27:47 ET T: 06/28/2012 06:58:10 ET JOB#: [id number]  cc: Katharina Caperima Kaleisha Bhargava, MD, <Dictator> PRIMARY CARE PHYSICIAN Graeme Menees MD ELECTRONICALLY SIGNED 07/04/2012 19:07

## 2014-05-19 NOTE — H&P (Signed)
PATIENT NAME:  Alyssa [name], Alyssa [name] MR#:  [id number] DATE OF BIRTH:  09/20/1940  DATE OF ADMISSION:  09/06/2012  PRIMARY CARE PHYSICIAN: I think is Dr. Terance HartBronstein at [facility].   CHIEF COMPLAINT: "I thought I had [name] stroke."   HISTORY OF PRESENT ILLNESS: This is [name] who came in to the ER last night, was found to have [name] urinary tract infection, was sent back to [facility] after given [name] dose of Rocephin, but lab then called them back and stated that the troponin was borderline at 0.06. They called the patient back because the patient did have some complaints of some shortness of breath. They repeated [name] troponin and it was the same at 0.06. Hospitalist services were contacted for further evaluation. The patient's initial complaints were she thought she was having [name] stroke. She had some numbness in the face. She was very lethargic, powerless, no energy. She thought something was wrong and asked to go the ER. She does have some shortness of breath. She does have [name] history of smoking. Some cough with yellowish phlegm.   PAST MEDICAL HISTORY: CVA, rheumatoid arthritis, coronary artery disease with 2 stents, peripheral vascular disease, diabetes, COPD, hyperlipidemia, diabetes, ulcer, diabetes neuropathy, chronic kidney disease and anemia. Also gastroesophageal reflux disease.   PAST SURGICAL HISTORY: Hysterectomy, gallbladder, tonsils, skin graft, amputation of the toe and cataracts.   ALLERGIES: ANTI-INFLAMMATORIES, CARDIZEM, CYMBALTA, IBUPROFEN, NSAIDS AND PENICILLIN.   MEDICATIONS: As per Prescription Writer include acetaminophen 325 mg every 6 hours as needed, albuterol nebulizer every 3 hours as needed, Aldactone 25 mg twice [name] day, Xanax 0.25 mg every 8 hours as needed, aspirin 325 mg daily, Coreg 12.5 mg twice [name] day, Plavix 75 mg daily, gabapentin 600 mg twice [name] day, aspart insulin sliding scale and 4 units prior to meals, Lantus 17 units subcutaneous injection at bedtime, Imdur 30 mg  extended-release daily, Lasix 20 mg daily, losartan 100 mg daily, MiraLAX 17 grams daily as needed for constipation, multivitamin daily, nitroglycerin p.r.n. 0.4 mg sublingually, oxycodone 5 mg every 6 hours, Protonix 40 mg daily, pravastatin 40 mg at bedtime, Requip 4 mg twice [name] day, vitamin C 500 mg twice [name] day, Zantac 75 mg twice [name] day, Ambien 5 mg at bedtime.   SOCIAL HISTORY: She is [name] retired Engineer, civil (consulting)nurse, currently at [facility]. Positive for smoker, 6 cigarettes per day. No alcohol. No drug use.   FAMILY HISTORY: Father died at 6153 in an accident. Mother died at 5981 of natural causes.   REVIEW OF SYSTEMS:    CONSTITUTIONAL: Positive for fever. Positive for sustained cold. Positive for weight loss. Positive for lethargy.  EYES: No visual problems.  EARS, NOSE, MOUTH AND THROAT: Positive for runny nose. No sore throat. No difficulty swallowing.  CARDIOVASCULAR: No chest pain. Positive for palpitations.  RESPIRATORY: Positive for shortness of breath. Positive for coughing up yellow phlegm.  GASTROINTESTINAL: No nausea. No vomiting. No abdominal pain. No diarrhea. No constipation. No bright red blood per rectum. No melena.  GENITOURINARY: Positive for burning on urination. Positive for blood in the urine.  MUSCULOSKELETAL: Positive for joint pains with rheumatoid arthritis.  INTEGUMENTARY: Positive for ulcer of the right lower extremity.  NEUROLOGICAL: No fainting or blackouts.  PSYCHIATRIC: Positive for anxiety.  ENDOCRINE: No thyroid problems.  HEMATOLOGIC AND LYMPHATIC: Positive for anemia.   PHYSICAL EXAMINATION: VITAL SIGNS: Temperature 100, pulse 60 respirations 18, blood pressure 147/68, pulse oximetry 96% on room air.  GENERAL: No respiratory distress.  EYES: Conjunctivae  and lids normal. Pupils equal, round and reactive to light. Extraocular muscles intact. No nystagmus.  EARS, NOSE, MOUTH AND THROAT: Tympanic membranes: No erythema. Nasal mucosa: No erythema. Throat: No erythema. No  exudate seen. Lips and gums: No lesions.  NECK: No JVD. No bruits. No lymphadenopathy. No thyromegaly. No thyroid nodules palpated.  LUNGS: Clear to auscultation. No use of accessory muscles to breathe. No rhonchi, rales or wheeze heard.  CARDIOVASCULAR: S1, S2 normal. Positive II/VI systolic ejection murmur. Carotid upstroke 2+ bilaterally. No bruits. Dorsalis pedis pulses difficult to palpate, 1+ edema of the lower extremity.  ABDOMEN: Soft, nontender. No organomegaly/splenomegaly. Normoactive bowel sounds. No masses felt.  LYMPHATIC: No lymph nodes in the neck.  MUSCULOSKELETAL: Trace edema. No clubbing. No cyanosis.  SKIN: Large ulcer on the posterior right leg with some drainage. No necrotic material seen.  NEUROLOGIC: Cranial nerves II through XII grossly intact. Deep tendon reflexes 1/2+ bilateral lower extremities.  PSYCHIATRIC: The patient is oriented to person, place and time.   LABORATORY AND RADIOLOGICAL DATA: Starting from yesterday's ER visit showed [name] troponin of 0.06. Magnesium 1.9. Lipase 48. White blood cell count 10.6, hemoglobin and hematocrit 9.9 and 28.1, platelet count 167. Glucose 217, BUN 29, creatinine 1.24, sodium 139, potassium 4.1, chloride 108, CO2 of 25, calcium 87, total bilirubin 0.4, alkaline phosphatase 201, ALT 15, AST 19, albumin 2.7. GFR 44. Chest x-ray: No acute abnormality. Urinalysis: 3+ blood, 3+ bacteria, positive for nitrites, positive for greater than 500 mg/dL of protein. CT scan of the head was negative. Next troponin borderline at 0.06. EKG: Normal sinus rhythm, 64 beats per minute, poor R wave progression.   ASSESSMENT AND PLAN: 1.  Elevated troponin with history of coronary artery disease: I am not quite sure what to make of this borderline troponin. I think it is [name] false positive with infection and chronic kidney disease. I offered the patient the opportunity to go back to [facility]. The patient states that she would rather stay overnight, but she  does not want any cardiac catheterization or stress test. I will order 2 more sets of cardiac enzymes. She is already on medical management including aspirin, Plavix, Coreg and statin.  2.  Urinary tract infection with low-grade fever: Urine culture sent from the Emergency Room last night. Will put on intravenous Cipro and follow up urine culture.  3.  Hypertension: Continue all the patient's usual medications.  4.  Hyperlipidemia: Check [name] lipid profile in the morning. Continue the statin.  5.  Diabetes with neuropathy: Continue Lantus and aspart insulin.  6.  Gastroesophageal reflux disease: On Protonix and Zantac.  7.  Tobacco abuse and chronic obstructive pulmonary disease: Smoking cessation counseling done 3 minutes by me. As-needed nicotine patch. Continue as-needed nebulizer.  8.  Peripheral vascular disease: On aspirin and Plavix. Benefits would be if the patient quit smoking.  9.  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 The patient is followed up at [facility] for this.  10.  Chronic kidney disease: Looking back at old creatinines, GFR was slightly better in the past. I will give 1 liter of intravenous fluid and see if the creatinine improves [name] little bit.   TIME SPENT ON ADMISSION: 55 minutes.   CODE STATUS: The patient is [name] full code.    ____________________________ Herschell Dimes. Renae Gloss, MD rjw:jm D: 09/06/2012 12:54:27 ET T: 09/06/2012 14:13:33 ET JOB#: [id number]  cc: Herschell Dimes. Renae Gloss, MD, <Dictator> Teena Irani. Terance Hart, MD Salley Scarlet  MD ELECTRONICALLY SIGNED 09/10/2012 16:57

## 2014-05-19 NOTE — Consult Note (Signed)
PATIENT NAME:  Alyssa [name], Alyssa [name] MR#:  [id number] DATE OF BIRTH:  Nov 01, 1940  DATE OF CONSULTATION:  09/07/2012  REFERRING PHYSICIAN:  Dr. Percell Boston PHYSICIAN:  Lamar Blinks, MD  REASON FOR CONSULTATION: Elevated troponin with old myocardial infarction, hypertension, hyperlipidemia, anemia and mitral valve disease.   CHIEF COMPLAINT: The patient is not very conversant and is somewhat ill. The patient has had new onset of significant urinary tract infection, for which he is appropriately using antibiotics. She is weak and fatigued at this time and has had no evidence of chest discomfort, but does have an elevated troponin more consistent with demand ischemia at this time. The patient has had an EKG showing normal sinus rhythm, but inferior infarct age undetermined with nonspecific ST changes consistent with her history of old myocardial infarction. She does have peripheral vascular disease, coronary artery disease, diabetes, chronic kidney disease and anemia, which have been relatively well controlled and treated with appropriate medication management as below. The patient has had continued tobacco abuse despite chronic obstructive pulmonary disease, for which we have recommended discontinuation and discussed its significant hazards. The patient currently is somewhat obtunded and sleepy.   REVIEW OF SYSTEMS: The remainder of review of systems cannot be assessed due to obtundation.   PAST MEDICAL HISTORY: 1.  Coronary artery disease with old myocardial infarction.  2.  Hypertension.  3.  Anemia.  4.  Diabetes.  5.  Hyperlipidemia.   FAMILY HISTORY: No family members with early onset of cardiovascular disease or hypertension.   SOCIAL HISTORY: Smokes 1/2 pack per day. She denies alcohol use.   ALLERGIES: AS LISTED.   MEDICATIONS: As listed.   PHYSICAL EXAMINATION: VITAL SIGNS: Blood pressure is 159/70 bilaterally, heart rate 70 upright, reclining, and regular.  GENERAL: She is a  well-appearing female in no acute distress.  HEENT: No icterus, thyromegaly, ulcers, hemorrhage or xanthelasma.  CARDIOVASCULAR: Regular rate and rhythm. Normal S1, S2 with II to III/VI apical murmur consistent with mitral regurgitation and II/VI right upper sternal border murmur, possibly aortic valve disease. PMI is inferiorly displaced. Carotid upstroke normal without bruit. Jugular venous pressure is normal.  LUNGS: Bibasilar crackles with normal respirations.  ABDOMEN: Soft, nontender, without hepatosplenomegaly or masses. Abdominal aorta is normal size without bruit.  EXTREMITIES: Show 2+ radial, femoral, trace dorsal pedal pulses with trace lower extremity edema. No cyanosis, clubbing or ulcers.  NEUROLOGIC: The patient is somewhat disoriented.   ASSESSMENT: This is a 74 year old female with old myocardial infarction, abnormal EKG and  minimal elevation of troponin most consistent with demand ischemia with current condition of illness and infection with mitral valve disease, anemia, diabetes and chronic kidney disease, needing further medical management and treatment options.   RECOMMENDATIONS: 1.  Continue serial ECG and enzymes to assess for possible myocardial infarction.  2.  Echocardiogram for old myocardial infarction.  3.  Hypertension control with ACE inhibitor, amlodipine, as able.  4.  Further treatment of anemia and further investigation as a cause of above.  5.  Diabetes control with a goal hemoglobin A1c below 7.  6.  Further consideration of ambulation and following for any other significant symptoms, possible chest pain and other risk stratification including the possibility of stress test thereafter.  7.  Further treatment of statin therapy for peripheral vascular disease and high-intensity therapy as able.   ____________________________ Lamar Blinks, MD bjk:jm D: 09/07/2012 12:51:27 ET T: 09/07/2012 13:17:58 ET JOB#: [id number]  cc: Lamar Blinks, MD,  <Dictator> Quentin Cornwall  Gwen PoundsKOWALSKI MD ELECTRONICALLY SIGNED 09/13/2012 13:58

## 2014-05-19 NOTE — H&P (Signed)
PATIENT NAME:  Alyssa [name], ENFIELD MR#:  [id number] DATE OF BIRTH:  07-10-1940  DATE OF ADMISSION:  03/06/2012  PRIMARY CARE PHYSICIAN:  In [place], vascular surgeon, Dr. Festus Barren.   PSYCHIATRIST:  Dr. Toni Amend  CHIEF COMPLAINT:  Chest pain and shortness of breath.   HISTORY OF PRESENT ILLNESS: A 74 year old Caucasian female with past medical history of diabetes mellitus, nephropathy, neuropathy, poor compliance with the medication, hypertension, hyperlipidemia, COPD, peripheral vascular disease, stent placement in left lower extremity a few months ago and questionable history of coronary artery disease. She was admitted in [facility] for infected gangrenous toe on right foot and after hospital stay, she was discharged to [facility] yesterday with advice to continue IV antibiotics through PICC line for the next few weeks for this infection and gangrene. Tuesday morning the patient reports that she started having chest pain, which is in the center, which is severe and getting relief slightly by sitting up in the bed. The patient also had similar pain yesterday and was relieved by itself by sitting up in the bed. The patient was brought to the ER with this.  Her EKG was normal, but her troponin was 0.1 and as she has strong positive history of peripheral vascular disease, status post stent and coronary artery disease, so she is being admitted for non-ST-elevation MI.  REVIEW OF SYSTEMS:  Negative for fever, fatigue, weakness or weight loss.  EYES:  No blurring, double vision. No pain or redness in the eyes.  ENT:  No tinnitus, ear pain, hearing loss.  RESPIRATORY:  No cough, wheezing, hemoptysis or dyspnea.  CARDIOVASCULAR:  Positive for chest pain. No edema on the legs. No palpitations or no syncopal episode.  GASTROINTESTINAL:  No nausea, vomiting, diarrhea or abdominal pain.  GENITOURINARY:  No dysuria, hematuria.  ENDOCRINOLOGY:  No increased thirst or increased urination.  HEMATOLOGY:  No  bleeding or no swollen glands.  MUSCULOSKELETAL:  No swelling or pain in the joints. Has amputated toe on the right side recently, dressing present.   NEUROLOGICAL:  No numbness, weakness, tremors or headaches.  PSYCHIATRIC:  No anxiety, insomnia or bipolar disorder.   PAST MEDICAL HISTORY: 1.  Peripheral vascular disease with stenting on the left lower extremity and amputation on the right side second toe recently. Was discharged with IV antibiotics to nursing home yesterday from [facility]. 2.  Diabetes mellitus with neuropathy and nephropathy. 3.  Hypertension.  4.  Hyperlipidemia.  5.  History of COPD. 6.  Degenerative joint disease.  7.  Coronary artery disease with stent placement as per her. We do not have any records.   PAST SURGICAL HISTORY: 1.  Status post hysterectomy.  2.  Status post stent placement and peripheral vascular disease in left lower extremity and amputation of the toe on the right side. 3.  Status post cholecystectomy.  4.  Appendectomy.   HOME MEDICATIONS: Vancomycin 1.5 grams IV daily. Ceftazidime 1 gram IV every 8 hours, Flagyl 500 mg oral every 8 hours, miconazole 2% cream vaginally 2 times a day, aspirin 81 mg daily, carvedilol 6.25 mg oral twice daily, losartan 100 mg oral daily, pravastatin 40 mg oral at bedtime, ropinirole 4 mg oral twice a day, OxyContin 10 mg extended release every 12 hours and oxycodone 5 mg immediate release every 6 hours as needed for pain, insulin sliding scale with coverage.  FAMILY HISTORY:  Stroke and coronary artery disease.  SOCIAL HISTORY: Recently discharged from hospital to nursing home for IV  antibiotics of her gangrene toe. She was smoking before that admission, but now on nicotine patchy.  Denies alcohol or illicit drug use.   PHYSICAL EXAMINATION: VITAL SIGNS: Temperature 98.4 degrees, blood pressure 126/92, pulse rate 86, oxygen saturation 96%.   GENERAL:  She is fully alert, oriented to time, place and person and  does not appear in any acute distress at this point of time. Well-developed and well-nourished as per her age.  HEAD AND NECK:  Atraumatic. Conjunctivae pink. Oral mucosa moist.  NECK:  Supple. No JVD.  RESPIRATORY:  Bilateral equal and clear and with no chest tenderness on local pressure.  CARDIOVASCULAR:  S1, S2 present, regular. No murmur.  ABDOMEN:  Soft, nontender, bowel sounds present. No organomegaly appreciated. BILATERAL LEGS: Mild edema present. Right-side dressing present on the toe and foot after amputation. Right upper limb PICC line present.  NEUROLOGICAL:  Grossly normal. Power 5/5 all 4 limbs. No tremors.  PSYCHIATRIC: Slightly hyperactive but does not appear in any acute distress at this point of time.  LABORATORY RESULTS:  Labs reviewed.  EKG not changed from the past. Troponin 0.1. Other:  Not very significant findings.   ASSESSMENT AND PLAN: A 74 year old female with strong past positive medical history for peripheral vascular disease and coronary artery disease came with chest pain and positive troponin.  1.  Non-ST-elevation myocardial infarction:  Lovenox 1 mg/kg subcutaneous every 12 hours. Aspirin 325 mg enteric coated oral daily. Plavix 75 mg oral daily. Pravastatin 40 mg oral at bedtime, carvedilol 6.25 mg oral b.i.d. Losartan 100 mg oral daily. Morphine as needed for pain. Nitroglycerin as needed for pain. We will get echocardiogram to assess for cardiac function and we will get cardiologic consult for further work-up. Will follow troponin every 8 hours for further evaluation.  2.  Diabetes mellitus: Will follow glucose and view insulin sliding scale coverage.  3.  Hypertension:  Will continue losartan and carvedilol.  4. Peripheral vascular disease status post amputation on the toe and was on IV antibiotic for infection. We will continue vancomycin, ceftazidime and Flagyl as she was taking at nursing home and we will try to get more documents from nursing home as how many  days we will continue.  5.  Chronic pain due to peripheral vascular disease and amputation. We will continue OxyContin and morphine as needed.  6.  Diabetic neuropathy:  Will continue gabapentin. 7.  History of chronic obstructive pulmonary disease:  The patient currently not in any acute distress.  8.  History of smoking:  The patient states she stopped smoking since last 1 month and she was currently on nicotine patch at nursing home. Will continue the same.  9.  Vaginal fungal infection:  Will continue miconazole cream with an application every 12 hours.  CODE STATUS:  FULL CODE.  Total Time Spent:  One hour    ____________________________ Hope PigeonVaibhavkumar G. Elisabeth PigeonVachhani, MD vgv:ce D: 03/06/2012 11:21:04 ET T: 03/06/2012 12:16:05 ET JOB#: [id number]  cc: Hope PigeonVaibhavkumar G. Elisabeth PigeonVachhani, MD, <Dictator> Altamese DillingVAIBHAVKUMAR Atsushi Yom MD ELECTRONICALLY SIGNED 03/19/2012 18:19

## 2014-05-19 NOTE — Op Note (Signed)
PATIENT NAME:  Alyssa [name], Alyssa [name] MR#:  [id number] DATE OF BIRTH:  1941/01/20  DATE OF PROCEDURE:  05/24/2012  PREOPERATIVE DIAGNOSIS: Cataract, right eye.   POSTOPERATIVE DIAGNOSIS: Cataract, right eye.   PROCEDURE PERFORMED: Extracapsular cataract extraction using phacoemulsification with placement of an Alcon SN6CWS 21.5-diopter posterior chamber lens, serial [id number].  SURGEON: Maylon PeppersSteven [name]. Levia Waltermire, M.D.   ANESTHESIA: 4% lidocaine and 0.75% Marcaine in [name] 50-50 mixture with 10 units/mL of Hylenex added, given as [name] peribulbar.   ANESTHESIOLOGIST: Dr. Pernell DupreAdams.   COMPLICATIONS: Posterior capsular rupture.   ESTIMATED BLOOD LOSS: Less than 1 mL.   DESCRIPTION OF PROCEDURE: The patient was brought to the operating room and given [name] peribulbar block and IV sedation. The patient was then prepped and draped in the usual fashion. The vertical rectus muscles were imbricated using 5-0 silk sutures, bridle sutures. [name] limbal peritomy was carried out for 1 o'clock hour at 12 o'clock and hemostasis was obtained with cautery. [name] partial thickness scleral groove was made at the posterior surgical limbus and dissected anteriorly into clear cornea with an Alcon crescent knife. The anterior chamber was entered superonasally through clear cornea with [name] paracentesis knife and through the lamellar dissection with [name] 2.6 mm keratome. DisCoVisc was used to replace the aqueous and [name] continuous tear circular capsulorrhexis was carried out. Hydrodissection was used to loosen the nucleus and phacoemulsification was carried out in [name] divide and conquer technique. The nucleus itself was very difficult to draw into the tip. Eventually the full nucleus was emulsified with an ultrasound time of 3 minutes and 58 seconds with an average power of 23.2% and CDE of 77.34. Once the nucleus had been removed, it was noted that there was an oval hole in the posterior capsule presumably from contact with the phacoemulsification tip.   There was no vitreous presenting.  DisCoVisc was used to tamponade the hole and INA was used to remove [name] small amount of residual cortex that was there. The capsular bag was then inflated and the pupil, which was midsized, was moved inferiorly with [name] Kuglen hook to inspect the posterior capsular hole. It appeared to be intact and was circular and the bag was filled with DisCoVisc. The intraocular lens was inserted in the capsular bag using [name] [employer] shooter. It was rotated horizontally to keep the haptics away from the inferior part of the tear. Irrigation-aspiration was then used to remove the residual DisCoVisc. The wound was inflated with balanced salt. [name] single 10-0 nylon suture was placed across the wound and Miostat was injected through the paracentesis track to induce miosis and deepen the chamber. The wound was checked for leaks, none were found. Suture was rotated superiorly into the cornea. The bridle sutures were removed and 2 drops of Vigamox were placed on the eye. The patient was then discharged to the recovery area in good condition with [name] shield on the eye.  ____________________________ Maylon PeppersSteven [name]. Chalsey Leeth, MD sad:sb D: 05/24/2012 14:10:58 ET T: 05/24/2012 14:51:13 ET JOB#: [id number]  cc: Viviann SpareSteven [name]. Kalei Mckillop, MD, <Dictator> Erline LevineSTEVEN [name] Shaquisha Wynn MD ELECTRONICALLY SIGNED 05/31/2012 9:47
